# Patient Record
Sex: Female | Born: 1980
Health system: Southern US, Community
[De-identification: ages and names within clinical notes are randomized; demographics above are authoritative.]

## PROBLEM LIST (undated history)

## (undated) ENCOUNTER — Inpatient Hospital Stay (HOSPITAL_COMMUNITY): Payer: Self-pay

## (undated) DIAGNOSIS — H9190 Unspecified hearing loss, unspecified ear: Secondary | ICD-10-CM

## (undated) HISTORY — DX: Unspecified hearing loss, unspecified ear: H91.90

## (undated) HISTORY — PX: OTHER SURGICAL HISTORY: SHX169

---

## 2011-02-06 ENCOUNTER — Encounter (HOSPITAL_COMMUNITY): Payer: Self-pay

## 2011-02-06 ENCOUNTER — Inpatient Hospital Stay (HOSPITAL_COMMUNITY)
Admission: AD | Admit: 2011-02-06 | Discharge: 2011-02-06 | Disposition: A | Discharge status: Home / Self Care | Payer: Self-pay | Source: Ambulatory Visit | Attending: Obstetrics and Gynecology | Admitting: Obstetrics and Gynecology

## 2011-02-06 DIAGNOSIS — O0932 Supervision of pregnancy with insufficient antenatal care, second trimester: Secondary | ICD-10-CM

## 2011-02-06 DIAGNOSIS — Z3201 Encounter for pregnancy test, result positive: Secondary | ICD-10-CM | POA: Insufficient documentation

## 2011-02-06 DIAGNOSIS — O09219 Supervision of pregnancy with history of pre-term labor, unspecified trimester: Secondary | ICD-10-CM | POA: Insufficient documentation

## 2011-02-06 LAB — URINALYSIS, ROUTINE W REFLEX MICROSCOPIC
Bilirubin Urine: NEGATIVE
Leukocytes, UA: NEGATIVE
Nitrite: NEGATIVE
Specific Gravity, Urine: >1.03 — ABNORMAL HIGH (ref 1.005–1.030)
pH: 6 (ref 5.0–8.0)

## 2011-02-06 LAB — POCT PREGNANCY, URINE: Preg Test, Ur: POSITIVE

## 2011-02-06 NOTE — Progress Notes (Signed)
Patient reports no period for 3 months, having urinary frequency, wants to make sure everything okay.

## 2011-02-06 NOTE — ED Provider Notes (Signed)
History     Chief Complaint  Patient presents with  . Urinary Frequency  . Possible Pregnancy   HPI Presents with c/o no period for 3 months. States someone told her she might be pregnant. So she came in to see if baby was OK.   No pain or bleeding. No complaints. Has no doctor.   Past Medical History  Diagnosis Date  . No pertinent past medical history     No past surgical history on file.  No family history on file.  History  Substance Use Topics  . Smoking status: Not on file  . Smokeless tobacco: Not on file  . Alcohol Use: Not on file    Allergies: No Known Allergies  No prescriptions prior to admission    ROS No complaints other than amenorrhea  Physical Exam   Blood pressure 110/75, pulse 84, temperature 98.8 F (37.1 C), temperature source Oral, resp. rate 16, height 5\' 2"  (1.575 m), weight 117 lb 6.4 oz (53.252 kg), last menstrual period 10/24/2010.  Physical Exam  Constitutional: She is oriented to person, place, and time. She appears well-developed and well-nourished.  HENT:  Head: Normocephalic.  Cardiovascular: Normal rate.   Respiratory: Effort normal.  GI: Soft. There is no tenderness.  Genitourinary: No vaginal discharge found.  Musculoskeletal: Normal range of motion.  Neurological: She is alert and oriented to person, place, and time.  Skin: Skin is warm and dry. No erythema.  Psychiatric: She has a normal mood and affect.   Bedside US:  SIUP, Fetus moving. FHR 160.  No measurements taken MAU Course  Procedures  MDM   Assessment and Plan  A:  Intrauterine Pregnancy at 15 weeks by dates.  No complaints  P:  Refer to [facility] for prenatal care Preg. Vertification letter  Wynelle Bourgeois 02/06/2011, 7:59 PM

## 2011-03-21 LAB — RUBELLA ANTIBODY, IGM: Rubella: IMMUNE

## 2011-03-21 LAB — RPR: RPR: NONREACTIVE

## 2011-03-21 LAB — HEPATITIS B SURFACE ANTIGEN: Hepatitis B Surface Ag: NEGATIVE

## 2011-03-21 LAB — HIV ANTIBODY (ROUTINE TESTING W REFLEX): HIV: NONREACTIVE

## 2011-03-21 LAB — VARICELLA ZOSTER ANTIBODY, IGG: Varicella: NON-IMMUNE/NOT IMMUNE

## 2011-03-21 LAB — ANTIBODY SCREEN: Antibody Screen: NEGATIVE

## 2011-07-08 ENCOUNTER — Inpatient Hospital Stay (HOSPITAL_COMMUNITY): Payer: Medicaid Other | Admitting: Anesthesiology

## 2011-07-08 ENCOUNTER — Inpatient Hospital Stay (HOSPITAL_COMMUNITY)
Admission: AD | Admit: 2011-07-08 | Discharge: 2011-07-10 | DRG: 775 | Disposition: A | Discharge status: Home / Self Care | Payer: Medicaid Other | Source: Ambulatory Visit | Attending: Obstetrics and Gynecology | Admitting: Obstetrics and Gynecology

## 2011-07-08 ENCOUNTER — Encounter (HOSPITAL_COMMUNITY): Payer: Self-pay | Admitting: *Deleted

## 2011-07-08 ENCOUNTER — Encounter (HOSPITAL_COMMUNITY): Payer: Self-pay | Admitting: Anesthesiology

## 2011-07-08 LAB — CBC
Platelets: 193 10*3/uL (ref 150–400)
RBC: 4.84 MIL/uL (ref 3.87–5.11)
RDW: 13.3 % (ref 11.5–15.5)
WBC: 13.3 10*3/uL — ABNORMAL HIGH (ref 4.0–10.5)

## 2011-07-08 LAB — RPR: RPR Ser Ql: NONREACTIVE

## 2011-07-08 MED ORDER — DIPHENHYDRAMINE HCL 50 MG/ML IJ SOLN: 12.5000 mg | INTRAMUSCULAR | Status: DC | PRN

## 2011-07-08 MED ORDER — DIBUCAINE 1 % RE OINT: 1.0000 "application " | TOPICAL_OINTMENT | RECTAL | Status: DC | PRN

## 2011-07-08 MED ORDER — WITCH HAZEL-GLYCERIN EX PADS: 1.0000 "application " | MEDICATED_PAD | CUTANEOUS | Status: DC | PRN

## 2011-07-08 MED ORDER — OXYTOCIN BOLUS FROM INFUSION
500.0000 mL | Freq: Once | INTRAVENOUS | Status: AC
  Administered 2011-07-08: 500 mL via INTRAVENOUS
  Filled 2011-07-08: qty 1000
  Filled 2011-07-08: qty 500

## 2011-07-08 MED ORDER — BENZOCAINE-MENTHOL 20-0.5 % EX AERO
INHALATION_SPRAY | CUTANEOUS | Status: AC
  Filled 2011-07-08: qty 56

## 2011-07-08 MED ORDER — CITRIC ACID-SODIUM CITRATE 334-500 MG/5ML PO SOLN: 30.0000 mL | ORAL | Status: DC | PRN

## 2011-07-08 MED ORDER — EPHEDRINE 5 MG/ML INJ
10.0000 mg | INTRAVENOUS | Status: DC | PRN
  Filled 2011-07-08: qty 4

## 2011-07-08 MED ORDER — BISACODYL 10 MG RE SUPP: 10.0000 mg | Freq: Every day | RECTAL | Status: DC | PRN

## 2011-07-08 MED ORDER — ONDANSETRON HCL 4 MG PO TABS: 4.0000 mg | ORAL_TABLET | ORAL | Status: DC | PRN

## 2011-07-08 MED ORDER — FLEET ENEMA 7-19 GM/118ML RE ENEM: 1.0000 | ENEMA | RECTAL | Status: DC | PRN

## 2011-07-08 MED ORDER — EPHEDRINE 5 MG/ML INJ
10.0000 mg | INTRAVENOUS | Status: DC | PRN
  Filled 2011-07-08 (×2): qty 4

## 2011-07-08 MED ORDER — FLEET ENEMA 7-19 GM/118ML RE ENEM: 1.0000 | ENEMA | Freq: Every day | RECTAL | Status: DC | PRN

## 2011-07-08 MED ORDER — ACETAMINOPHEN 325 MG PO TABS: 650.0000 mg | ORAL_TABLET | ORAL | Status: DC | PRN

## 2011-07-08 MED ORDER — FENTANYL 2.5 MCG/ML BUPIVACAINE 1/10 % EPIDURAL INFUSION (WH - ANES)
14.0000 mL/h | INTRAMUSCULAR | Status: DC
  Administered 2011-07-08: 14 mL/h via EPIDURAL
  Filled 2011-07-08: qty 60

## 2011-07-08 MED ORDER — LACTATED RINGERS IV SOLN
INTRAVENOUS | Status: DC
  Administered 2011-07-08: 16:00:00 via INTRAVENOUS

## 2011-07-08 MED ORDER — DIPHENHYDRAMINE HCL 25 MG PO CAPS: 25.0000 mg | ORAL_CAPSULE | Freq: Four times a day (QID) | ORAL | Status: DC | PRN

## 2011-07-08 MED ORDER — OXYCODONE-ACETAMINOPHEN 5-325 MG PO TABS
1.0000 | ORAL_TABLET | ORAL | Status: DC | PRN
  Administered 2011-07-10: 1 via ORAL
  Filled 2011-07-08: qty 1

## 2011-07-08 MED ORDER — OXYTOCIN 20 UNITS IN LACTATED RINGERS INFUSION - SIMPLE: 125.0000 mL/h | Freq: Once | INTRAVENOUS | Status: DC

## 2011-07-08 MED ORDER — SIMETHICONE 80 MG PO CHEW: 80.0000 mg | CHEWABLE_TABLET | ORAL | Status: DC | PRN

## 2011-07-08 MED ORDER — TETANUS-DIPHTH-ACELL PERTUSSIS 5-2.5-18.5 LF-MCG/0.5 IM SUSP: 0.5000 mL | Freq: Once | INTRAMUSCULAR | Status: DC

## 2011-07-08 MED ORDER — IBUPROFEN 600 MG PO TABS
600.0000 mg | ORAL_TABLET | Freq: Four times a day (QID) | ORAL | Status: DC
  Administered 2011-07-08 – 2011-07-10 (×7): 600 mg via ORAL
  Filled 2011-07-08 (×2): qty 1
  Filled 2011-07-08: qty 2
  Filled 2011-07-08 (×3): qty 1

## 2011-07-08 MED ORDER — SENNOSIDES-DOCUSATE SODIUM 8.6-50 MG PO TABS
2.0000 | ORAL_TABLET | Freq: Every day | ORAL | Status: DC
  Administered 2011-07-08 – 2011-07-09 (×2): 2 via ORAL

## 2011-07-08 MED ORDER — ONDANSETRON HCL 4 MG/2ML IJ SOLN: 4.0000 mg | INTRAMUSCULAR | Status: DC | PRN

## 2011-07-08 MED ORDER — BENZOCAINE-MENTHOL 20-0.5 % EX AERO
1.0000 "application " | INHALATION_SPRAY | CUTANEOUS | Status: DC | PRN
  Administered 2011-07-08: 1 via TOPICAL

## 2011-07-08 MED ORDER — LIDOCAINE HCL (PF) 1 % IJ SOLN
30.0000 mL | INTRAMUSCULAR | Status: DC | PRN
  Administered 2011-07-08: 30 mL via SUBCUTANEOUS
  Filled 2011-07-08: qty 30

## 2011-07-08 MED ORDER — OXYCODONE-ACETAMINOPHEN 5-325 MG PO TABS: 1.0000 | ORAL_TABLET | ORAL | Status: DC | PRN

## 2011-07-08 MED ORDER — PHENYLEPHRINE 40 MCG/ML (10ML) SYRINGE FOR IV PUSH (FOR BLOOD PRESSURE SUPPORT)
80.0000 ug | PREFILLED_SYRINGE | INTRAVENOUS | Status: DC | PRN
  Filled 2011-07-08 (×2): qty 5

## 2011-07-08 MED ORDER — ONDANSETRON HCL 4 MG/2ML IJ SOLN: 4.0000 mg | Freq: Four times a day (QID) | INTRAMUSCULAR | Status: DC | PRN

## 2011-07-08 MED ORDER — PRENATAL MULTIVITAMIN CH
1.0000 | ORAL_TABLET | Freq: Every day | ORAL | Status: DC
  Administered 2011-07-09 – 2011-07-10 (×2): 1 via ORAL
  Filled 2011-07-08 (×2): qty 1

## 2011-07-08 MED ORDER — LACTATED RINGERS IV SOLN
500.0000 mL | Freq: Once | INTRAVENOUS | Status: AC
  Administered 2011-07-08: 500 mL via INTRAVENOUS

## 2011-07-08 MED ORDER — IBUPROFEN 600 MG PO TABS: 600.0000 mg | ORAL_TABLET | Freq: Four times a day (QID) | ORAL | Status: DC | PRN

## 2011-07-08 MED ORDER — LACTATED RINGERS IV SOLN: 500.0000 mL | INTRAVENOUS | Status: DC | PRN

## 2011-07-08 MED ORDER — ZOLPIDEM TARTRATE 5 MG PO TABS: 5.0000 mg | ORAL_TABLET | Freq: Every evening | ORAL | Status: DC | PRN

## 2011-07-08 MED ORDER — LANOLIN HYDROUS EX OINT: TOPICAL_OINTMENT | CUTANEOUS | Status: DC | PRN

## 2011-07-08 MED ORDER — PHENYLEPHRINE 40 MCG/ML (10ML) SYRINGE FOR IV PUSH (FOR BLOOD PRESSURE SUPPORT)
80.0000 ug | PREFILLED_SYRINGE | INTRAVENOUS | Status: DC | PRN
  Filled 2011-07-08: qty 5

## 2011-07-08 MED ORDER — LIDOCAINE HCL 1.5 % IJ SOLN
INTRAMUSCULAR | Status: DC | PRN
  Administered 2011-07-08 (×2): 5 mL via EPIDURAL

## 2011-07-08 NOTE — H&P (Signed)
Amanda Farley is a 31 y.o. female presenting for SROM and UCs. Cx in office is 4/C/0/vtx with clear gross ROM.  No CNS change, epigastric pain. Maternal Medical History:  Reason for admission: Reason for admission: rupture of membranes and contractions.  Contractions: Onset was 3-5 hours ago.   Frequency: regular.   Perceived severity is strong.    Fetal activity: Perceived fetal activity is normal.      OB History    Grav Para Term Preterm Abortions TAB SAB Ect Mult Living   1              Past Medical History  Diagnosis Date  . No pertinent past medical history    History reviewed. No pertinent past surgical history. Family History: family history is not on file. Social History:  reports that she has never smoked. She does not have any smokeless tobacco history on file. She reports that she does not drink alcohol or use illicit drugs.  Review of Systems  Eyes: Negative for blurred vision.  Gastrointestinal: Negative for abdominal pain.  Neurological: Negative for headaches.    Dilation: 10 Effacement (%): 100 Station: +2 Exam by:: [email or web address]-ob Blood pressure 116/63, pulse 74, temperature 98.2 F (36.8 C), temperature source Oral, resp. rate 20, height 5\' 2"  (1.575 m), weight 58.968 kg (130 lb), last menstrual period 10/24/2010, SpO2 100.00%. Maternal Exam:  Abdomen: Patient reports no abdominal tenderness.   Fetal Exam Fetal Monitor Review: Pattern: accelerations present.       Physical Exam  Cardiovascular: Normal rate and regular rhythm.   Respiratory: Effort normal and breath sounds normal.  Neurological: She has normal reflexes.    Prenatal labs: ABO, Rh: O/Positive/-- (10/15 0000) Antibody: Negative (10/15 0000) Rubella: Immune (10/15 0000) RPR: Nonreactive (10/15 0000)  HBsAg: Negative (10/15 0000)  HIV: Non-reactive (10/15 0000)  GBS: Negative (10/15 0000)   Assessment/Plan: 31 yo G1Po at 36 5/7 weeks in labor with SROM Admit   Sair Faulcon II,Reyaansh Merlo  E 07/08/2011, 4:58 PM

## 2011-07-08 NOTE — Anesthesia Preprocedure Evaluation (Signed)

## 2011-07-08 NOTE — Plan of Care (Signed)
Problem: Consults Goal: Birthing Suites Patient Information Press F2 to bring up selections list  Outcome: Completed/Met Date Met:  07/08/11  Pt < [redacted] weeks EGA

## 2011-07-08 NOTE — Progress Notes (Signed)
Delivery note SVD VMI  Apgars 9/9 Art pH/ weight pending Placenta 3 vessels/ intact EBL  250cc Second degree rt periurethral lac repaired Cx/vag intact Pt/infant stable in LDR

## 2011-07-08 NOTE — Anesthesia Procedure Notes (Signed)
Epidural Patient location during procedure: OB Start time: 07/08/2011 1:56 PM  Staffing Anesthesiologist: Brayton Caves R Performed by: anesthesiologist   Preanesthetic Checklist Completed: patient identified, site marked, surgical consent, pre-op evaluation, timeout performed, IV checked, risks and benefits discussed and monitors and equipment checked  Epidural Patient position: sitting Prep: site prepped and draped and DuraPrep Patient monitoring: continuous pulse ox and blood pressure Approach: midline Injection technique: LOR air and LOR saline  Needle:  Needle type: Tuohy  Needle gauge: 17 G Needle length: 9 cm Needle insertion depth: 5 cm cm Catheter type: closed end flexible Catheter size: 19 Gauge Catheter at skin depth: 10 cm Test dose: negative  Assessment Events: blood not aspirated, injection not painful, no injection resistance, negative IV test and no paresthesia  Additional Notes Patient identified.  Risk benefits discussed including failed block, incomplete pain control, headache, nerve damage, paralysis, blood pressure changes, nausea, vomiting, reactions to medication both toxic or allergic, and postpartum back pain.  Patient expressed understanding and wished to proceed.  All questions were answered.  Sterile technique used throughout procedure and epidural site dressed with sterile barrier dressing. No paresthesia or other complications noted.The patient did not experience any signs of intravascular injection such as tinnitus or metallic taste in mouth nor signs of intrathecal spread such as rapid motor block. Please see nursing notes for vital signs.

## 2011-07-09 LAB — CBC
Hemoglobin: 12.9 g/dL (ref 12.0–15.0)
MCH: 27.4 pg (ref 26.0–34.0)
MCHC: 32.8 g/dL (ref 30.0–36.0)
Platelets: 216 10*3/uL (ref 150–400)
RBC: 4.7 MIL/uL (ref 3.87–5.11)

## 2011-07-09 MED ORDER — PRENATAL MULTIVITAMIN CH: 1.0000 | ORAL_TABLET | Freq: Every day | ORAL | Status: DC

## 2011-07-09 MED ORDER — OXYCODONE-ACETAMINOPHEN 5-325 MG PO TABS: 1.0000 | ORAL_TABLET | ORAL | Status: AC | PRN

## 2011-07-09 MED ORDER — IBUPROFEN 600 MG PO TABS: 600.0000 mg | ORAL_TABLET | Freq: Four times a day (QID) | ORAL | Status: AC

## 2011-07-09 NOTE — Discharge Summary (Signed)
Obstetric Discharge Summary Reason for Admission: onset of labor Prenatal Procedures: ultrasound Intrapartum Procedures: spontaneous vaginal delivery Postpartum Procedures: none Complications-Operative and Postpartum: none Hemoglobin  Date Value Range Status  07/09/2011 12.9  12.0-15.0 (g/dL) Final     HCT  Date Value Range Status  07/09/2011 39.3  36.0-46.0 (%) Final    Discharge Diagnoses: Preterm pregnancy delivered  Discharge Information: Date: 07/09/2011 Activity: pelvic rest Diet: routine Medications: PNV, Ibuprofen and Percocet Condition: stable Instructions: refer to practice specific booklet Discharge to: home Follow-up Information    Call to follow up.         Newborn Data: Live born female  Birth Weight: 6 lb 15.5 oz (3161 g) APGAR: 9, 9  Home with mother.  Annebelle Bostic II,Timberlynn Kizziah E 07/09/2011, 8:44 AM2

## 2011-07-09 NOTE — Progress Notes (Signed)
Voiding, decreasing lochia, little pain  Blood pressure 98/65, pulse 82, temperature 97.4 F (36.3 C), temperature source Oral, resp. rate 18, height 5\' 2"  (1.575 m), weight 58.968 kg (130 lb), last menstrual period 10/24/2010, SpO2 99.00%, unknown if currently breastfeeding.  FFNT  Results for orders placed during the hospital encounter of 07/08/11 (from the past 24 hour(s))  CBC     Status: Abnormal   Collection Time   07/08/11 12:40 PM      Component Value Range   WBC 13.3 (*) 4.0 - 10.5 (K/uL)   RBC 4.84  3.87 - 5.11 (MIL/uL)   Hemoglobin 13.8  12.0 - 15.0 (g/dL)   HCT 21.3  08.6 - 57.8 (%)   MCV 84.5  78.0 - 100.0 (fL)   MCH 28.5  26.0 - 34.0 (pg)   MCHC 33.7  30.0 - 36.0 (g/dL)   RDW 46.9  62.9 - 52.8 (%)   Platelets 193  150 - 400 (K/uL)  RPR     Status: Normal   Collection Time   07/08/11 12:40 PM      Component Value Range   RPR NON REACTIVE  NON REACTIVE   CBC     Status: Abnormal   Collection Time   07/09/11  5:33 AM      Component Value Range   WBC 16.0 (*) 4.0 - 10.5 (K/uL)   RBC 4.70  3.87 - 5.11 (MIL/uL)   Hemoglobin 12.9  12.0 - 15.0 (g/dL)   HCT 41.3  24.4 - 01.0 (%)   MCV 83.6  78.0 - 100.0 (fL)   MCH 27.4  26.0 - 34.0 (pg)   MCHC 32.8  30.0 - 36.0 (g/dL)   RDW 27.2  53.6 - 64.4 (%)   Platelets 216  150 - 400 (K/uL)  A: satisfactory progress P: pt wants to go home today     Instructions given     D/C at 5 pm Pt inquires about circumcision of female child     D/W pt and husband circumcision of female infant, risks and benefits reviewed-they state they understand, want circumcision for female child

## 2011-07-09 NOTE — Progress Notes (Signed)
Nursery will not discharge baby today Will discontinue D/C order for patient

## 2011-07-09 NOTE — Anesthesia Postprocedure Evaluation (Signed)
  Anesthesia Post-op Note  Patient: Buyer, retail  Procedure(s) Performed: * No procedures listed *  Patient Location: PACU and Mother/Baby  Anesthesia Type: Epidural  Level of Consciousness: awake, alert  and oriented  Airway and Oxygen Therapy: Patient Spontanous Breathing  Post-op Pain: mild  Post-op Assessment: Patient's Cardiovascular Status Stable, Respiratory Function Stable, Patent Airway, No signs of Nausea or vomiting and Pain level controlled  Post-op Vital Signs: stable  Complications: No apparent anesthesia complications

## 2011-07-10 NOTE — Progress Notes (Signed)
Ready to go  Blood pressure 91/49, pulse 80, temperature 98.4 F (36.9 C), temperature source Oral, resp. rate 18, height 5\' 2"  (1.575 m), weight 58.968 kg (130 lb), last menstrual period 10/24/2010, SpO2 99.00%, unknown if currently breastfeeding.  FFNT  A: satisfactory P: D/C home     Instructions reveiwed

## 2011-07-10 NOTE — Progress Notes (Deleted)
No CNS changes/epigastric pain No flatus yet, tolerating regular diet, ambulating, voiding Baby in NICU for hypoglycemia  Blood pressure 91/49, pulse 80, temperature 98.4 F (36.9 C), temperature source Oral, resp. rate 18, height 5\' 2"  (1.575 m), weight 58.968 kg (130 lb), last menstrual period 10/24/2010, SpO2 99.00%, unknown if currently breastfeeding.  Abd soft, good BS, dressing C&D  LFTs improving  A:Preeclampsia-resolving  P: observe until tomorrow     Check LFT tomorrow

## 2011-07-11 NOTE — Progress Notes (Signed)
Post discharge chart review completed.  

## 2012-06-06 HISTORY — PX: INNER EAR SURGERY: SHX679

## 2012-08-11 ENCOUNTER — Ambulatory Visit: Payer: Self-pay | Admitting: Family Medicine

## 2012-08-11 VITALS — BP 98/74 | HR 97 | Temp 98.4°F | Resp 16 | Ht 63.0 in | Wt 103.0 lb

## 2012-08-11 DIAGNOSIS — H7202 Central perforation of tympanic membrane, left ear: Secondary | ICD-10-CM

## 2012-08-11 DIAGNOSIS — H7200 Central perforation of tympanic membrane, unspecified ear: Secondary | ICD-10-CM

## 2012-08-11 DIAGNOSIS — J019 Acute sinusitis, unspecified: Secondary | ICD-10-CM

## 2012-08-11 MED ORDER — PREDNISONE 20 MG PO TABS: ORAL_TABLET | ORAL | Status: DC

## 2012-08-11 MED ORDER — AMOXICILLIN 500 MG PO CAPS: 1000.0000 mg | ORAL_CAPSULE | Freq: Three times a day (TID) | ORAL | Status: DC

## 2012-08-11 NOTE — Progress Notes (Signed)
Subjective:    Patient ID: Amanda Farley, female    DOB: 1980/10/06, 32 y.o.   MRN: [id number] Chief Complaint  Patient presents with  . Sore Throat    2 weeks  . URI    with eyes watery  . Headache    HPI headache and sore throat, whole face and jaw aching.  She has noticed decreased hearing on the left and her ears feel stuffed up/blocked, no drainage and has not been cleaning ears out w/ qtip.  She doesn't have health insurance (trying to get ObamaCare) so avoids coming to doctor but just kept getting more and more ill.  Has not tried any otc meds or home remedies.  History slightly limited by language - pt speaks [place] but native language is [place].  Past Medical History  Diagnosis Date  . No pertinent past medical history    Current Outpatient Prescriptions on File Prior to Visit  Medication Sig Dispense Refill  . Prenatal Vit-Fe Fumarate-FA (PRENATAL MULTIVITAMIN) TABS Take 1 tablet by mouth daily.  30 tablet  2   No current facility-administered medications on file prior to visit.   No Known Allergies  Review of Systems  Constitutional: Positive for diaphoresis, activity change, appetite change and fatigue. Negative for fever and chills.  HENT: Positive for hearing loss, ear pain, congestion, sore throat, rhinorrhea, sneezing, postnasal drip and sinus pressure. Negative for nosebleeds, trouble swallowing, neck pain, neck stiffness, dental problem, voice change, tinnitus and ear discharge.   Eyes: Negative for discharge and itching.  Respiratory: Positive for cough. Negative for shortness of breath.   Cardiovascular: Negative for chest pain.  Gastrointestinal: Negative for nausea, vomiting and abdominal pain.  Musculoskeletal: Positive for myalgias. Negative for joint swelling.  Skin: Negative for rash.  Neurological: Positive for headaches. Negative for dizziness and syncope.  Hematological: Positive for adenopathy.  Psychiatric/Behavioral: Positive for sleep  disturbance.      BP 98/74  Pulse 97  Temp(Src) 98.4 F (36.9 C) (Oral)  Resp 16  Ht 5\' 3"  (1.6 m)  Wt 103 lb (46.72 kg)  BMI 18.25 kg/m2  SpO2 100% Objective:   Physical Exam  Constitutional: She is oriented to person, place, and time. She appears well-developed and well-nourished. She appears lethargic. She appears ill. No distress.  HENT:  Head: Normocephalic and atraumatic.  Right Ear: External ear and ear canal normal. Tympanic membrane is retracted. A middle ear effusion is present.  Left Ear: There is drainage and tenderness. Tympanic membrane is perforated and erythematous. A middle ear effusion is present. Decreased hearing is noted.  Nose: Mucosal edema and rhinorrhea present. Right sinus exhibits maxillary sinus tenderness. Left sinus exhibits maxillary sinus tenderness.  Mouth/Throat: Uvula is midline and mucous membranes are normal. Posterior oropharyngeal erythema present. No oropharyngeal exudate, posterior oropharyngeal edema or tonsillar abscesses.  Large perf in lower L TM with moderate amount of erythema and purulent exudate  Eyes: Conjunctivae are normal. Right eye exhibits no discharge. Left eye exhibits no discharge. No scleral icterus.  Neck: Normal range of motion. Neck supple.  Cardiovascular: Normal rate, regular rhythm, normal heart sounds and intact distal pulses.   Pulmonary/Chest: Effort normal and breath sounds normal.  Lymphadenopathy:       Head (right side): Submandibular adenopathy present. No preauricular and no posterior auricular adenopathy present.       Head (left side): Submandibular adenopathy present. No preauricular and no posterior auricular adenopathy present.    She has no cervical adenopathy.  Right: No supraclavicular adenopathy present.       Left: No supraclavicular adenopathy present.  Neurological: She is oriented to person, place, and time. She appears lethargic.  Skin: Skin is warm and dry. She is not diaphoretic. No  erythema.  Psychiatric: She has a normal mood and affect. Her behavior is normal.      Assessment & Plan:  Sinusitis, acute  Tympanic membrane central perforation, left - strict instructions and print outs given to not put anything into ear - don't get any water into ear - block ear with large cotton ball whenever showering or washing face and never submerge head.    Meds ordered this encounter  Medications  . amoxicillin (AMOXIL) 500 MG capsule    Sig: Take 2 capsules (1,000 mg total) by mouth 3 (three) times daily.    Dispense:  84 capsule    Refill:  0  . predniSONE (DELTASONE) 20 MG tablet    Sig: Take 3 tabs x 2d, 2 tabs x 2d, 1 tab x 2d.    Dispense:  12 tablet    Refill:  0

## 2012-08-11 NOTE — Patient Instructions (Addendum)
Hot showers or breathing in steam may help loosen the congestion.  Using a netti pot or sinus rinse is also likely to help you feel better and keep this from progressing.  Use the oxymetazoline or Afrin nasal spray as needed throughout the day but do not sue for longer than 3 days..  I recommend augmenting with 12 hr sudafed (behind the counter) and generic mucinex to help you move out the congestion.  If no improvement or you are getting worse, come back or call but hopefully with all of the above, you can avoid it.   Tympanic Membrane Perforation The eardrum (tympanic membrane) protects the inner ear from the outside environment. In addition to protection, the eardrum allows you to hear by transmitting sound waves to the bones in your ear and then to the nervous system. The tympanic membrane is easily perforated, which may result in damage to the inner ear. SYMPTOMS   Sometimes there are no symptoms.  Decreased hearing.  Fluid drainage from ear.  Ear pain. CAUSES   Most commonly, a middle ear infection from built-up pressure.  Injury from a cotton swab.  Traumatic injury to the side of the head. RISK INCREASES WITH:  Frequent middle ear infections.  Use of cotton swabs. PREVENTION   Do not use cotton swabs to clean the ear canal.  If you have ear pain or pressure, see your caregiver to rule out an ear infection that needs treatment. TREATMENT  Protecting the inner ear and allowing the membrane to heal on its own is how tympanic membrane rupture is usually treated. Healing may take several weeks. In order to protect the inner ear, do not allow any fluid to enter the ear canal. Avoid being submerged in water. The use of ear drops may prevent an ear infection from developing, but they should be used with caution, as ear drops can also cause damage to the inner ear. It is important to follow up with your caregiver to confirm healing of the tympanic membrane. If the membrane does not  heal, permanent hearing loss may occur. To avoid serious complications, tympanic membranes that do not heal on their own are repaired with surgery. Document Released: 05/23/2005 Document Revised: 08/15/2011 Document Reviewed: 09/04/2008 [facility] Patient Information 2013 [place], [place].   Sinusitis Sinusitis an infection of the air pockets (sinuses) in your face. This can cause puffiness (swelling). It can also cause drainage from your sinuses.  HOME CARE   Only take medicine as told by your doctor.  Drink enough fluids to keep your pee (urine) clear or pale yellow.  Apply moist heat or ice packs for pain relief.  Use salt (saline) nose sprays. The spray will wet the thick fluid in the nose. This can help the sinuses drain. GET HELP RIGHT AWAY IF:   You have a fever.  Your baby is older than 3 months with a rectal temperature of 102 F (38.9 C) or higher.  Your baby is 17 months old or younger with a rectal temperature of 100.4 F (38 C) or higher.  The pain gets worse.  You get a very bad headache.  You keep throwing up (vomiting).  Your face gets puffy. MAKE SURE YOU:   Understand these instructions.  Will watch your condition.  Will get help right away if you are not doing well or get worse. Document Released: 11/09/2007 Document Revised: 08/15/2011 Document Reviewed: 11/09/2007 [facility] Patient Information 2013 [place], [place].

## 2012-08-20 ENCOUNTER — Ambulatory Visit: Payer: Self-pay | Admitting: Family Medicine

## 2012-08-20 VITALS — BP 132/84 | HR 71 | Temp 98.4°F | Resp 17 | Ht 63.0 in | Wt 106.0 lb

## 2012-08-20 DIAGNOSIS — R5381 Other malaise: Secondary | ICD-10-CM

## 2012-08-20 DIAGNOSIS — R5383 Other fatigue: Secondary | ICD-10-CM

## 2012-08-20 DIAGNOSIS — J209 Acute bronchitis, unspecified: Secondary | ICD-10-CM

## 2012-08-20 LAB — POCT CBC
Hemoglobin: 14.5 g/dL (ref 12.2–16.2)
Lymph, poc: 2.3 (ref 0.6–3.4)
MCH, POC: 27.9 pg (ref 27–31.2)
MCHC: 32.4 g/dL (ref 31.8–35.4)
MCV: 85.9 fL (ref 80–97)
MID (cbc): 0.5 (ref 0–0.9)
MPV: 7.9 fL (ref 0–99.8)
POC MID %: 6.8 %M (ref 0–12)
RBC: 5.2 M/uL (ref 4.04–5.48)
WBC: 7.7 10*3/uL (ref 4.6–10.2)

## 2012-08-20 MED ORDER — ALBUTEROL SULFATE HFA 108 (90 BASE) MCG/ACT IN AERS: 2.0000 | INHALATION_SPRAY | RESPIRATORY_TRACT | Status: DC | PRN

## 2012-08-20 MED ORDER — IPRATROPIUM BROMIDE 0.03 % NA SOLN: 2.0000 | Freq: Four times a day (QID) | NASAL | Status: DC

## 2012-08-20 MED ORDER — LEVOFLOXACIN 500 MG PO TABS: 500.0000 mg | ORAL_TABLET | Freq: Every day | ORAL | Status: DC

## 2012-08-20 MED ORDER — OFLOXACIN 0.3 % OT SOLN: 5.0000 [drp] | Freq: Two times a day (BID) | OTIC | Status: DC

## 2012-08-20 MED ORDER — FLUTICASONE PROPIONATE 50 MCG/ACT NA SUSP: 2.0000 | Freq: Every day | NASAL | Status: DC

## 2012-08-20 NOTE — Progress Notes (Addendum)
Subjective:    Patient ID: Amanda Farley, female    DOB: [birth year], 32 y.o.   MRN: [id number] Chief Complaint  Patient presents with  . Dizziness  . Fatigue  . Nasal Congestion  . Shortness of Breath    HPI   I saw Amanda Farley in the office 9d ago and put her on a course of high dose amoxicillin for a sinus infection which she has been taking but she still just doesn't feel good. At that appt she was also have an exudative pharyngitis and was found to have a left TM perf with decreased hearing.  She has now been ill for almost a month.  Only drinking 1 bottle of water daily. Also drinking soda.  Feeling lightheaded - shakey.  She feels she has chills.  The face pressure is less and the sore throat is gone but is having some sweats, chills, fatigued.    Still with a ton of nasal congestion and having occ wheezing, no cough.  She has been very careful of her ear and cautious not to be any water in it.  Past Medical History  Diagnosis Date  . No pertinent past medical history    Current Outpatient Prescriptions on File Prior to Visit  Medication Sig Dispense Refill  . amoxicillin (AMOXIL) 500 MG capsule Take 2 capsules (1,000 mg total) by mouth 3 (three) times daily.  84 capsule  0  . Prenatal Vit-Fe Fumarate-FA (PRENATAL MULTIVITAMIN) TABS Take 1 tablet by mouth daily.  30 tablet  2  . predniSONE (DELTASONE) 20 MG tablet Take 3 tabs x 2d, 2 tabs x 2d, 1 tab x 2d.  12 tablet  0   No current facility-administered medications on file prior to visit.   No Known Allergies  Review of Systems  Constitutional: Positive for chills, diaphoresis, activity change, appetite change and fatigue. Negative for fever and unexpected weight change.  HENT: Positive for hearing loss, congestion, rhinorrhea and sneezing. Negative for ear pain, nosebleeds, sore throat, trouble swallowing, neck pain, neck stiffness, voice change, postnasal drip, sinus pressure and ear discharge.   Eyes: Negative for discharge and  itching.  Respiratory: Positive for shortness of breath and wheezing. Negative for cough.   Cardiovascular: Negative for chest pain and palpitations.  Gastrointestinal: Negative for nausea, vomiting and abdominal pain.  Endocrine: Negative for polydipsia and polyuria.  Genitourinary: Positive for decreased urine volume. Negative for dysuria, urgency and difficulty urinating.  Skin: Negative for rash.  Neurological: Positive for dizziness and light-headedness. Negative for syncope and headaches.  Hematological: Negative for adenopathy.  Psychiatric/Behavioral: Negative for sleep disturbance.      BP 132/84  Pulse 71  Temp(Src) 98.4 F (36.9 C) (Oral)  Resp 17  Ht 5\' 3"  (1.6 m)  Wt 106 lb (48.081 kg)  BMI 18.78 kg/m2  SpO2 99%  LMP 08/10/2012  Breastfeeding? No Objective:   Physical Exam  Constitutional: She is oriented to person, place, and time. She appears well-developed and well-nourished. No distress.  HENT:  Head: Normocephalic and atraumatic.  Right Ear: Tympanic membrane, external ear and ear canal normal.  Left Ear: There is drainage. No tenderness. Tympanic membrane is perforated. Decreased hearing is noted.  Nose: Rhinorrhea present. No mucosal edema.  Mouth/Throat: Uvula is midline, oropharynx is clear and moist and mucous membranes are normal. No oropharyngeal exudate, posterior oropharyngeal edema or posterior oropharyngeal erythema.  Eyes: Conjunctivae are normal. Right eye exhibits no discharge. Left eye exhibits no discharge. No scleral icterus.  Neck: Normal  range of motion. Neck supple.  Cardiovascular: Normal rate, regular rhythm, normal heart sounds and intact distal pulses.   Pulmonary/Chest: Effort normal and breath sounds normal.  Abdominal: Soft. Bowel sounds are normal. She exhibits no distension and no mass. There is no tenderness. There is no rebound and no guarding.  Lymphadenopathy:       Head (right side): Submandibular adenopathy present. No  tonsillar, no preauricular, no posterior auricular and no occipital adenopathy present.       Head (left side): Submandibular adenopathy present. No tonsillar, no preauricular, no posterior auricular and no occipital adenopathy present.    She has cervical adenopathy.       Right cervical: Superficial cervical adenopathy present.       Left cervical: Superficial cervical adenopathy present.       Right: No supraclavicular adenopathy present.       Left: No supraclavicular adenopathy present.  Neurological: She is alert and oriented to person, place, and time.  Skin: Skin is warm and dry. She is not diaphoretic. No erythema.  Psychiatric: She has a normal mood and affect. Her behavior is normal.       Results for orders placed in visit on 08/20/12  POCT CBC      Result Value Range   WBC 7.7  4.6 - 10.2 K/uL   Lymph, poc 2.3  0.6 - 3.4   POC LYMPH PERCENT 29.5  10 - 50 %L   MID (cbc) 0.5  0 - 0.9   POC MID % 6.8  0 - 12 %M   POC Granulocyte 4.9  2 - 6.9   Granulocyte percent 63.7  37 - 80 %G   RBC 5.20  4.04 - 5.48 M/uL   Hemoglobin 14.5  12.2 - 16.2 g/dL   HCT, POC 16.1  09.6 - 47.9 %   MCV 85.9  80 - 97 fL   MCH, POC 27.9  27 - 31.2 pg   MCHC 32.4  31.8 - 35.4 g/dL   RDW, POC 04.5     Platelet Count, POC 436 (*) 142 - 424 K/uL   MPV 7.9  0 - 99.8 fL   Assessment & Plan:  Left TM perf - give it sev more wks of antibiotics and no water in ear. If hearing does not come back, call and we will refer to ENT. Pt would like to avoid this if possible as she does not have health insurance and the cost may be prohibitive but she would really like to get her hearing back. Other malaise and fatigue - Plan: POCT CBC  Acute bronchitis - Exam much improved from last visit but she is still feeling poorly so will switch her antibiotic therapy and try to direct symptomatic treatment to her nasal congestion.  Try prn albuterol for the wheezing. See pt instructions for complete plan.  Meds ordered  this encounter  Medications  . levofloxacin (LEVAQUIN) 500 MG tablet    Sig: Take 1 tablet (500 mg total) by mouth daily.    Dispense:  7 tablet    Refill:  0  . ipratropium (ATROVENT) 0.03 % nasal spray    Sig: Place 2 sprays into the nose 4 (four) times daily.    Dispense:  30 mL    Refill:  1  . fluticasone (FLONASE) 50 MCG/ACT nasal spray    Sig: Place 2 sprays into the nose at bedtime.    Dispense:  16 g    Refill:  1  .  albuterol (PROVENTIL HFA;VENTOLIN HFA) 108 (90 BASE) MCG/ACT inhaler    Sig: Inhale 2 puffs into the lungs every 4 (four) hours as needed for wheezing (cough, shortness of breath or wheezing.).    Dispense:  1 Inhaler    Refill:  1  . ofloxacin (FLOXIN) 0.3 % otic solution    Sig: Place 5 drops into the left ear 2 (two) times daily.    Dispense:  5 mL    Refill:  0

## 2013-08-28 ENCOUNTER — Ambulatory Visit (INDEPENDENT_AMBULATORY_CARE_PROVIDER_SITE_OTHER): Payer: BC Managed Care – PPO | Admitting: Physician Assistant

## 2013-08-28 VITALS — BP 100/70 | HR 82 | Temp 99.9°F | Resp 18 | Ht 63.0 in | Wt 108.2 lb

## 2013-08-28 DIAGNOSIS — J329 Chronic sinusitis, unspecified: Secondary | ICD-10-CM

## 2013-08-28 DIAGNOSIS — R0981 Nasal congestion: Secondary | ICD-10-CM

## 2013-08-28 DIAGNOSIS — H9190 Unspecified hearing loss, unspecified ear: Secondary | ICD-10-CM

## 2013-08-28 DIAGNOSIS — J3489 Other specified disorders of nose and nasal sinuses: Secondary | ICD-10-CM

## 2013-08-28 MED ORDER — AMOXICILLIN-POT CLAVULANATE 875-125 MG PO TABS: 1.0000 | ORAL_TABLET | Freq: Two times a day (BID) | ORAL | Status: DC

## 2013-08-28 MED ORDER — IPRATROPIUM BROMIDE 0.06 % NA SOLN: 2.0000 | Freq: Three times a day (TID) | NASAL | Status: DC

## 2013-08-28 NOTE — Progress Notes (Signed)
Subjective:    Patient ID: Amanda Farley, female    DOB: 06/22/1980, 33 y.o.   MRN: [id number]  HPI Primary Physician: No primary provider on file.  Chief Complaint: URI x 3 days  HPI: 33 y.o. female with history below presents with 3 day history of nasal congestion, rhinorrhea, post nasal drip, sinus pressure, sneezing, sore throat, fever, chills, fatigue, and myalgias. No cough or SOB. Some wheezing. Headache located along the frontal and bilateral temples. Left greater than right otalgia. Muffled hearing. No drainage from the ears. Boyfriend sick with similar symptoms. He was diagnosed with sinus infection. Currently on antibiotic as of the previous day.   Secondly, she mentions a long standing history of decreased hearing of the left ear and having had a history of a ruptured TM as a child. This was finally repaired in October 2014, back in [place]. She continues to note decreased hearing in the left ear as well as off and on pain in the left ear since this procedure. She wants to have her hearing back to baseline in the left ear again. No drainage or discharge from the ear. She does not recall an initial injury or trauma that caused the perforation to begin with as a child.    Past Medical History  Diagnosis Date  . No pertinent past medical history      Home Meds: Prior to Admission medications   Medication Sig Start Date End Date Taking? Authorizing Provider                                                             Allergies: No Known Allergies  History   Social History  . Marital Status: Single    Spouse Name: N/A    Number of Children: N/A  . Years of Education: N/A   Occupational History  . Not on file.   Social History Main Topics  . Smoking status: Never Smoker   . Smokeless tobacco: Not on file  . Alcohol Use: No  . Drug Use: No  . Sexual Activity: Yes   Other Topics Concern  . Not on file   Social History Narrative  . No  narrative on file     Review of Systems  Constitutional: Positive for fever, chills, appetite change and fatigue.  HENT: Positive for congestion, ear pain, hearing loss, postnasal drip, rhinorrhea, sinus pressure, sneezing and sore throat.        Nasal congestion.   Respiratory: Positive for wheezing. Negative for cough and shortness of breath.   Gastrointestinal: Negative for nausea, vomiting and diarrhea.  Musculoskeletal: Positive for myalgias.  Neurological: Positive for headaches.       Frontal sinus and bilateral temples.        Objective:   Physical Exam  Physical Exam: Blood pressure 100/70, pulse 82, temperature 99.9 F (37.7 C), temperature source Oral, resp. rate 18, height 5\' 3"  (1.6 m), weight 108 lb 3.2 oz (49.079 kg), last menstrual period 08/27/2013, SpO2 100.00%., Body mass index is 19.17 kg/(m^2). General: Well developed, well nourished, in no acute distress. Head: Normocephalic, atraumatic, eyes without discharge, sclera non-icteric, nares are congested. Bilateral auditory canals clear. Right TM without perforation, pearly grey and translucent with reflective cone of light. Left TM with moderate amount  of scar tissue. No perforation.  Oral cavity moist, posterior pharynx with post nasal drip and mild erythema. No exudate or peritonsillar abscess. Uvula midline.  Neck: Supple. No thyromegaly. Full ROM. No lymphadenopathy. No nuchal rigidity.  Lungs: Clear bilaterally to auscultation without wheezes, rales, or rhonchi. Breathing is unlabored. Heart: RRR with S1 S2. No murmurs, rubs, or gallops appreciated. Msk:  Strength and tone normal for age. Extremities/Skin: Warm and dry. No clubbing or cyanosis. No edema. No rashes or suspicious lesions. Neuro: Alert and oriented X 3. Moves all extremities spontaneously. Gait is normal. CNII-XII grossly in tact. Psych:  Responds to questions appropriately with a normal affect.         Assessment & Plan:  33 year old female  with sinusitis and decreased/scared left tympanic membrane  1) Sinusitis -Augmentin 875/125 mg 1 po bid #20 no RF -Atrovent NS 0.06% 2 sprays each nare bid prn #1 no RF -Mucinex -Rest/fluids -RTC precautions  2) Decreased hearing/scarring of the left TM -This is a long term issue for her dating back to her childhood. In October of 2014, back in her home country of [place], she recently had repair of longstanding ruptured left TM. She still complains of decreased hearing and occasional pain along the left ear. No drainage or discharge. She wants to be able to hear in that ear again.  -Referral to ENT -Discussed evaluation and therapy options   Eula Listen, MHS, PA-C [facility] [address] [place], [place] [address] [phone] [facility] 08/28/2013 2:13 PM

## 2013-10-24 LAB — OB RESULTS CONSOLE HEPATITIS B SURFACE ANTIGEN: Hepatitis B Surface Ag: NEGATIVE

## 2013-10-24 LAB — OB RESULTS CONSOLE HIV ANTIBODY (ROUTINE TESTING): HIV: NONREACTIVE

## 2013-10-24 LAB — OB RESULTS CONSOLE RPR: RPR: NONREACTIVE

## 2013-10-24 LAB — OB RESULTS CONSOLE ABO/RH: RH TYPE: POSITIVE

## 2013-10-24 LAB — OB RESULTS CONSOLE ANTIBODY SCREEN: ANTIBODY SCREEN: NEGATIVE

## 2013-10-24 LAB — OB RESULTS CONSOLE RUBELLA ANTIBODY, IGM: Rubella: IMMUNE

## 2013-11-11 LAB — OB RESULTS CONSOLE GC/CHLAMYDIA
CHLAMYDIA, DNA PROBE: NEGATIVE
Gonorrhea: NEGATIVE

## 2014-05-03 ENCOUNTER — Inpatient Hospital Stay (HOSPITAL_COMMUNITY)
Admission: AD | Admit: 2014-05-03 | Discharge: 2014-05-03 | Disposition: A | Discharge status: Home / Self Care | Payer: BC Managed Care – PPO | Source: Ambulatory Visit | Attending: Obstetrics and Gynecology | Admitting: Obstetrics and Gynecology

## 2014-05-03 ENCOUNTER — Encounter (HOSPITAL_COMMUNITY): Payer: Self-pay | Admitting: *Deleted

## 2014-05-03 DIAGNOSIS — A084 Viral intestinal infection, unspecified: Secondary | ICD-10-CM | POA: Diagnosis not present

## 2014-05-03 DIAGNOSIS — E860 Dehydration: Secondary | ICD-10-CM | POA: Insufficient documentation

## 2014-05-03 DIAGNOSIS — Z3A35 35 weeks gestation of pregnancy: Secondary | ICD-10-CM | POA: Diagnosis not present

## 2014-05-03 DIAGNOSIS — O26893 Other specified pregnancy related conditions, third trimester: Secondary | ICD-10-CM | POA: Insufficient documentation

## 2014-05-03 LAB — CBC WITH DIFFERENTIAL/PLATELET
Basophils Absolute: 0 10*3/uL (ref 0.0–0.1)
Basophils Relative: 0 % (ref 0–1)
EOS PCT: 0 % (ref 0–5)
Eosinophils Absolute: 0 10*3/uL (ref 0.0–0.7)
HEMATOCRIT: 34.9 % — AB (ref 36.0–46.0)
Hemoglobin: 11.5 g/dL — ABNORMAL LOW (ref 12.0–15.0)
LYMPHS ABS: 0.7 10*3/uL (ref 0.7–4.0)
Lymphocytes Relative: 6 % — ABNORMAL LOW (ref 12–46)
MCH: 27.3 pg (ref 26.0–34.0)
MCHC: 33 g/dL (ref 30.0–36.0)
MCV: 82.7 fL (ref 78.0–100.0)
MONO ABS: 0.8 10*3/uL (ref 0.1–1.0)
Monocytes Relative: 7 % (ref 3–12)
Neutro Abs: 10.7 10*3/uL — ABNORMAL HIGH (ref 1.7–7.7)
Neutrophils Relative %: 87 % — ABNORMAL HIGH (ref 43–77)
Platelets: 154 10*3/uL (ref 150–400)
RBC: 4.22 MIL/uL (ref 3.87–5.11)
RDW: 13.4 % (ref 11.5–15.5)
WBC: 12.2 10*3/uL — ABNORMAL HIGH (ref 4.0–10.5)

## 2014-05-03 LAB — URINALYSIS, ROUTINE W REFLEX MICROSCOPIC
BILIRUBIN URINE: NEGATIVE
Glucose, UA: NEGATIVE mg/dL
HGB URINE DIPSTICK: NEGATIVE
Ketones, ur: >80 mg/dL — AB
Nitrite: NEGATIVE
Protein, ur: NEGATIVE mg/dL
Specific Gravity, Urine: >1.03 — ABNORMAL HIGH (ref 1.005–1.030)
UROBILINOGEN UA: 0.2 mg/dL (ref 0.0–1.0)
pH: 6.5 (ref 5.0–8.0)

## 2014-05-03 LAB — URINE MICROSCOPIC-ADD ON

## 2014-05-03 MED ORDER — PROMETHAZINE HCL 12.5 MG PO TABS: 12.5000 mg | ORAL_TABLET | Freq: Four times a day (QID) | ORAL | Status: DC | PRN

## 2014-05-03 MED ORDER — PROMETHAZINE HCL 25 MG/ML IJ SOLN
25.0000 mg | INTRAMUSCULAR | Status: DC
  Administered 2014-05-03: 25 mg via INTRAVENOUS
  Filled 2014-05-03: qty 1

## 2014-05-03 NOTE — MAU Note (Signed)
Patient presents with complaints of abdominal pain and vomiting since 2400 last night.

## 2014-05-03 NOTE — Discharge Instructions (Signed)
Dehydration, Adult Dehydration is when you lose more fluids from the body than you take in. Vital organs like the kidneys, brain, and heart cannot function without a proper amount of fluids and salt. Any loss of fluids from the body can cause dehydration.  CAUSES   Vomiting.  Diarrhea.  Excessive sweating.  Excessive urine output.  Fever. SYMPTOMS  Mild dehydration  Thirst.  Dry lips.  Slightly dry mouth. Moderate dehydration  Very dry mouth.  Sunken eyes.  Skin does not bounce back quickly when lightly pinched and released.  Dark urine and decreased urine production.  Decreased tear production.  Headache. Severe dehydration  Very dry mouth.  Extreme thirst.  Rapid, weak pulse (more than 100 beats per minute at rest).  Cold hands and feet.  Not able to sweat in spite of heat and temperature.  Rapid breathing.  Blue lips.  Confusion and lethargy.  Difficulty being awakened.  Minimal urine production.  No tears. DIAGNOSIS  Your caregiver will diagnose dehydration based on your symptoms and your exam. Blood and urine tests will help confirm the diagnosis. The diagnostic evaluation should also identify the cause of dehydration. TREATMENT  Treatment of mild or moderate dehydration can often be done at home by increasing the amount of fluids that you drink. It is best to drink small amounts of fluid more often. Drinking too much at one time can make vomiting worse. Refer to the home care instructions below. Severe dehydration needs to be treated at the hospital where you will probably be given intravenous (IV) fluids that contain water and electrolytes. HOME CARE INSTRUCTIONS   Ask your caregiver about specific rehydration instructions.  Drink enough fluids to keep your urine clear or pale yellow.  Drink small amounts frequently if you have nausea and vomiting.  Eat as you normally do.  Avoid:  Foods or drinks high in sugar.  Carbonated  drinks.  Juice.  Extremely hot or cold fluids.  Drinks with caffeine.  Fatty, greasy foods.  Alcohol.  Tobacco.  Overeating.  Gelatin desserts.  Wash your hands well to avoid spreading bacteria and viruses.  Only take over-the-counter or prescription medicines for pain, discomfort, or fever as directed by your caregiver.  Ask your caregiver if you should continue all prescribed and over-the-counter medicines.  Keep all follow-up appointments with your caregiver. SEEK MEDICAL CARE IF:  You have abdominal pain and it increases or stays in one area (localizes).  You have a rash, stiff neck, or severe headache.  You are irritable, sleepy, or difficult to awaken.  You are weak, dizzy, or extremely thirsty. SEEK IMMEDIATE MEDICAL CARE IF:   You are unable to keep fluids down or you get worse despite treatment.  You have frequent episodes of vomiting or diarrhea.  You have blood or green matter (bile) in your vomit.  You have blood in your stool or your stool looks black and tarry.  You have not urinated in 6 to 8 hours, or you have only urinated a small amount of very dark urine.  You have a fever.  You faint. MAKE SURE YOU:   Understand these instructions.  Will watch your condition.  Will get help right away if you are not doing well or get worse. Document Released: 05/23/2005 Document Revised: 08/15/2011 Document Reviewed: 01/10/2011 ExitCare Patient Information 2015 [employer]. This information is not intended to replace advice given to you by your health care provider. Make sure you discuss any questions you have with your health care   provider.   Viral Gastroenteritis Viral gastroenteritis is also known as stomach flu. This condition affects the stomach and intestinal tract. It can cause sudden diarrhea and vomiting. The illness typically lasts 3 to 8 days. Most people develop an immune response that eventually gets rid of the virus. While this natural  response develops, the virus can make you quite ill. CAUSES  Many different viruses can cause gastroenteritis, such as rotavirus or noroviruses. You can catch one of these viruses by consuming contaminated food or water. You may also catch a virus by sharing utensils or other personal items with an infected person or by touching a contaminated surface. SYMPTOMS  The most common symptoms are diarrhea and vomiting. These problems can cause a severe loss of body fluids (dehydration) and a body salt (electrolyte) imbalance. Other symptoms may include:  Fever.  Headache.  Fatigue.  Abdominal pain. DIAGNOSIS  Your caregiver can usually diagnose viral gastroenteritis based on your symptoms and a physical exam. A stool sample may also be taken to test for the presence of viruses or other infections. TREATMENT  This illness typically goes away on its own. Treatments are aimed at rehydration. The most serious cases of viral gastroenteritis involve vomiting so severely that you are not able to keep fluids down. In these cases, fluids must be given through an intravenous line (IV). HOME CARE INSTRUCTIONS   Drink enough fluids to keep your urine clear or pale yellow. Drink small amounts of fluids frequently and increase the amounts as tolerated.  Ask your caregiver for specific rehydration instructions.  Avoid:  Foods high in sugar.  Alcohol.  Carbonated drinks.  Tobacco.  Juice.  Caffeine drinks.  Extremely hot or cold fluids.  Fatty, greasy foods.  Too much intake of anything at one time.  Dairy products until 24 to 48 hours after diarrhea stops.  You may consume probiotics. Probiotics are active cultures of beneficial bacteria. They may lessen the amount and number of diarrheal stools in adults. Probiotics can be found in yogurt with active cultures and in supplements.  Wash your hands well to avoid spreading the virus.  Only take over-the-counter or prescription medicines for  pain, discomfort, or fever as directed by your caregiver. Do not give aspirin to children. Antidiarrheal medicines are not recommended.  Ask your caregiver if you should continue to take your regular prescribed and over-the-counter medicines.  Keep all follow-up appointments as directed by your caregiver. SEEK IMMEDIATE MEDICAL CARE IF:   You are unable to keep fluids down.  You do not urinate at least once every 6 to 8 hours.  You develop shortness of breath.  You notice blood in your stool or vomit. This may look like coffee grounds.  You have abdominal pain that increases or is concentrated in one small area (localized).  You have persistent vomiting or diarrhea.  You have a fever.  The patient is a child younger than 3 months, and he or she has a fever.  The patient is a child older than 3 months, and he or she has a fever and persistent symptoms.  The patient is a child older than 3 months, and he or she has a fever and symptoms suddenly get worse.  The patient is a baby, and he or she has no tears when crying. MAKE SURE YOU:   Understand these instructions.  Will watch your condition.  Will get help right away if you are not doing well or get worse. Document Released: 05/23/2005 Document Revised:   08/15/2011 Document Reviewed: 03/09/2011 ExitCare Patient Information 2015 [employer]. This information is not intended to replace advice given to you by your health care provider. Make sure you discuss any questions you have with your health care provider.  

## 2014-05-03 NOTE — MAU Provider Note (Signed)
Chief Complaint:  Abdominal Pain and Emesis   First Provider Initiated Contact with Patient 05/03/14 1002      HPI: Amanda Farley is a 33 y.o. G2P1001 at 5644w3d who reports onset last night about midnight of nausea and vomiting. She has vomited several times. Has retained no food or fluid since before midnight. Since episode began, she's been having generalized intermittent abdominal pain throughout abdomen and worse with fetal movement. No others in household are ill.  Denies contractions, leakage of fluid or vaginal bleeding. Good fetal movement.   ROS: Pertinent findings in history of present illness. Denies diarrhea, fever, dysuria, hematuria, frequency or urgency of urination, back pain. No respiratory symptoms or malaise.  Pregnancy Course: Essentially uncomplicated  Past Medical History: Past Medical History  Diagnosis Date  . Medical history non-contributory     Past obstetric history: OB History  Gravida Para Term Preterm AB SAB TAB Ectopic Multiple Living  2 1 1       2     # Outcome Date GA Lbr Len/2nd Weight Sex Delivery Anes PTL Lv  2 Current           1 Term 07/08/11    M Vag-Spont EPI  Y      Past Surgical History: Past Surgical History  Procedure Laterality Date  . Inner ear surgery Left 2014     Family History: History reviewed. No pertinent family history.  Social History: History  Substance Use Topics  . Smoking status: Never Smoker   . Smokeless tobacco: Never Used  . Alcohol Use: No    Allergies: No Known Allergies  Meds:  No prescriptions prior to admission      Physical Exam  Blood pressure 105/60, pulse 105, temperature 99 F (37.2 C), temperature source Oral, resp. rate 16, height 5\' 2"  (1.575 m), weight 60.328 kg (133 lb), last menstrual period 08/28/2013. GENERAL: Well-developed, well-nourished female in no acute distress, appears fatigued HEENT: normocephalic HEART: normal rate RESP: normal effort ABDOMEN: Soft, non-tender, gravid  appropriate for gestational age EXTREMITIES: Nontender, no edema NEURO: alert and oriented    Dilation: 1 Effacement (%): Thick Station: -3 Presentation: Vertex Exam by:: D. Poe  FHT:  Baseline 150 , moderate variability, accelerations present, no decelerations Contractions: q 2-4 mins, mild   Labs: Results for orders placed or performed during the hospital encounter of 05/03/14 (from the past 24 hour(s))  Urinalysis, Routine w reflex microscopic     Status: Abnormal   Collection Time: 05/03/14  9:20 AM  Result Value Ref Range   Color, Urine YELLOW YELLOW   APPearance CLEAR CLEAR   Specific Gravity, Urine >1.030 (H) 1.005 - 1.030   pH 6.5 5.0 - 8.0   Glucose, UA NEGATIVE NEGATIVE mg/dL   Hgb urine dipstick NEGATIVE NEGATIVE   Bilirubin Urine NEGATIVE NEGATIVE   Ketones, ur >80 (A) NEGATIVE mg/dL   Protein, ur NEGATIVE NEGATIVE mg/dL   Urobilinogen, UA 0.2 0.0 - 1.0 mg/dL   Nitrite NEGATIVE NEGATIVE   Leukocytes, UA SMALL (A) NEGATIVE  Urine microscopic-add on     Status: Abnormal   Collection Time: 05/03/14  9:20 AM  Result Value Ref Range   Squamous Epithelial / LPF MANY (A) RARE   WBC, UA 3-6 <3 WBC/hpf   Bacteria, UA RARE RARE   Urine-Other MUCOUS PRESENT   CBC with Differential     Status: Abnormal   Collection Time: 05/03/14 10:15 AM  Result Value Ref Range   WBC 12.2 (H) 4.0 - 10.5  K/uL   RBC 4.22 3.87 - 5.11 MIL/uL   Hemoglobin 11.5 (L) 12.0 - 15.0 g/dL   HCT 40.934.9 (L) 81.136.0 - 91.446.0 %   MCV 82.7 78.0 - 100.0 fL   MCH 27.3 26.0 - 34.0 pg   MCHC 33.0 30.0 - 36.0 g/dL   RDW 78.213.4 95.611.5 - 21.315.5 %   Platelets 154 150 - 400 K/uL   Neutrophils Relative % 87 (H) 43 - 77 %   Neutro Abs 10.7 (H) 1.7 - 7.7 K/uL   Lymphocytes Relative 6 (L) 12 - 46 %   Lymphs Abs 0.7 0.7 - 4.0 K/uL   Monocytes Relative 7 3 - 12 %   Monocytes Absolute 0.8 0.1 - 1.0 K/uL   Eosinophils Relative 0 0 - 5 %   Eosinophils Absolute 0.0 0.0 - 0.7 K/uL   Basophils Relative 0 0 - 1 %   Basophils  Absolute 0.0 0.0 - 0.1 K/uL   Smear Review MORPHOLOGY UNREMARKABLE     Imaging:  No results found. MAU Course: IV LR 1000 with Phenergan 25 mg> felt better and retaining juice and crackers No vomiting while here  Assessment:  G2P1002 at 3920w3d Category 1 FHR 1. Viral gastroenteritis   2. Dehydration     Plan: D/W Dr. Marcelle OverlieHolland Discharge home Labor precautions and fetal kick counts    Medication List    TAKE these medications        promethazine 12.5 MG tablet  Commonly known as:  PHENERGAN  Take 1 tablet (12.5 mg total) by mouth every 6 (six) hours as needed for nausea or vomiting.       Follow-up Information    Follow up with Meriel PicaHOLLAND,RICHARD M, MD.   Specialty:  Obstetrics and Gynecology   Why:  Keep your scheduled prenatal appointment   Contact information:   [address] [place] [id number] [phone]       Danae Orleanseirdre C Poe, CNM 05/03/2014 10:06 AM

## 2014-05-05 ENCOUNTER — Encounter (HOSPITAL_COMMUNITY): Payer: Self-pay

## 2014-05-08 LAB — OB RESULTS CONSOLE GBS: STREP GROUP B AG: NEGATIVE

## 2014-05-13 LAB — OB RESULTS CONSOLE GBS: STREP GROUP B AG: NEGATIVE

## 2014-06-06 ENCOUNTER — Inpatient Hospital Stay (HOSPITAL_COMMUNITY)
Admission: AD | Admit: 2014-06-06 | Discharge: 2014-06-08 | DRG: 775 | Disposition: A | Discharge status: Home / Self Care | Payer: Medicaid Other | Source: Ambulatory Visit | Attending: Obstetrics and Gynecology | Admitting: Obstetrics and Gynecology

## 2014-06-06 ENCOUNTER — Encounter (HOSPITAL_COMMUNITY): Payer: Self-pay

## 2014-06-06 DIAGNOSIS — O09523 Supervision of elderly multigravida, third trimester: Secondary | ICD-10-CM

## 2014-06-06 DIAGNOSIS — Z3A40 40 weeks gestation of pregnancy: Secondary | ICD-10-CM | POA: Diagnosis present

## 2014-06-06 NOTE — MAU Note (Signed)
Water broke at 7:30 pm, clear fluid with blood tinge. Felt pressure in bottom when walking, every 20 min or so.  Baby moving but slower today since 12 pm.

## 2014-06-07 ENCOUNTER — Encounter (HOSPITAL_COMMUNITY): Payer: Self-pay | Admitting: *Deleted

## 2014-06-07 DIAGNOSIS — O09523 Supervision of elderly multigravida, third trimester: Secondary | ICD-10-CM | POA: Diagnosis not present

## 2014-06-07 DIAGNOSIS — Z3A40 40 weeks gestation of pregnancy: Secondary | ICD-10-CM | POA: Diagnosis present

## 2014-06-07 LAB — TYPE AND SCREEN
ABO/RH(D): O POS
ANTIBODY SCREEN: NEGATIVE

## 2014-06-07 LAB — CBC
HCT: 32.5 % — ABNORMAL LOW (ref 36.0–46.0)
HEMATOCRIT: 33.8 % — AB (ref 36.0–46.0)
HEMOGLOBIN: 10.6 g/dL — AB (ref 12.0–15.0)
HEMOGLOBIN: 10.9 g/dL — AB (ref 12.0–15.0)
MCH: 26 pg (ref 26.0–34.0)
MCH: 26.2 pg (ref 26.0–34.0)
MCHC: 32.2 g/dL (ref 30.0–36.0)
MCHC: 32.6 g/dL (ref 30.0–36.0)
MCV: 80.2 fL (ref 78.0–100.0)
MCV: 80.7 fL (ref 78.0–100.0)
Platelets: 171 10*3/uL (ref 150–400)
Platelets: 190 10*3/uL (ref 150–400)
RBC: 4.05 MIL/uL (ref 3.87–5.11)
RBC: 4.19 MIL/uL (ref 3.87–5.11)
RDW: 14.3 % (ref 11.5–15.5)
RDW: 14.4 % (ref 11.5–15.5)
WBC: 13 10*3/uL — ABNORMAL HIGH (ref 4.0–10.5)
WBC: 7.4 10*3/uL (ref 4.0–10.5)

## 2014-06-07 LAB — POCT FERN TEST: POCT FERN TEST: POSITIVE

## 2014-06-07 LAB — ABO/RH: ABO/RH(D): O POS

## 2014-06-07 MED ORDER — SIMETHICONE 80 MG PO CHEW: 80.0000 mg | CHEWABLE_TABLET | ORAL | Status: DC | PRN

## 2014-06-07 MED ORDER — ONDANSETRON HCL 4 MG PO TABS: 4.0000 mg | ORAL_TABLET | ORAL | Status: DC | PRN

## 2014-06-07 MED ORDER — IBUPROFEN 600 MG PO TABS
600.0000 mg | ORAL_TABLET | Freq: Four times a day (QID) | ORAL | Status: DC
  Administered 2014-06-07 – 2014-06-08 (×5): 600 mg via ORAL
  Filled 2014-06-07 (×5): qty 1

## 2014-06-07 MED ORDER — ACETAMINOPHEN 325 MG PO TABS
650.0000 mg | ORAL_TABLET | ORAL | Status: DC | PRN
Start: 2014-06-07 — End: 2014-06-07

## 2014-06-07 MED ORDER — LIDOCAINE HCL (PF) 1 % IJ SOLN
30.0000 mL | INTRAMUSCULAR | Status: DC | PRN
  Filled 2014-06-07: qty 30

## 2014-06-07 MED ORDER — LANOLIN HYDROUS EX OINT: TOPICAL_OINTMENT | CUTANEOUS | Status: DC | PRN

## 2014-06-07 MED ORDER — TETANUS-DIPHTH-ACELL PERTUSSIS 5-2.5-18.5 LF-MCG/0.5 IM SUSP: 0.5000 mL | Freq: Once | INTRAMUSCULAR | Status: DC

## 2014-06-07 MED ORDER — DIBUCAINE 1 % RE OINT: 1.0000 "application " | TOPICAL_OINTMENT | RECTAL | Status: DC | PRN

## 2014-06-07 MED ORDER — SENNOSIDES-DOCUSATE SODIUM 8.6-50 MG PO TABS
2.0000 | ORAL_TABLET | ORAL | Status: DC
  Administered 2014-06-08: 2 via ORAL
  Filled 2014-06-07: qty 2

## 2014-06-07 MED ORDER — CITRIC ACID-SODIUM CITRATE 334-500 MG/5ML PO SOLN: 30.0000 mL | ORAL | Status: DC | PRN

## 2014-06-07 MED ORDER — DIPHENHYDRAMINE HCL 25 MG PO CAPS: 25.0000 mg | ORAL_CAPSULE | Freq: Four times a day (QID) | ORAL | Status: DC | PRN

## 2014-06-07 MED ORDER — ZOLPIDEM TARTRATE 5 MG PO TABS: 5.0000 mg | ORAL_TABLET | Freq: Every evening | ORAL | Status: DC | PRN

## 2014-06-07 MED ORDER — OXYCODONE-ACETAMINOPHEN 5-325 MG PO TABS: 2.0000 | ORAL_TABLET | ORAL | Status: DC | PRN

## 2014-06-07 MED ORDER — LACTATED RINGERS IV SOLN
INTRAVENOUS | Status: DC
  Administered 2014-06-07: 01:00:00 via INTRAVENOUS

## 2014-06-07 MED ORDER — LACTATED RINGERS IV SOLN: 500.0000 mL | INTRAVENOUS | Status: DC | PRN

## 2014-06-07 MED ORDER — WITCH HAZEL-GLYCERIN EX PADS: 1.0000 "application " | MEDICATED_PAD | CUTANEOUS | Status: DC | PRN

## 2014-06-07 MED ORDER — OXYCODONE-ACETAMINOPHEN 5-325 MG PO TABS: 1.0000 | ORAL_TABLET | ORAL | Status: DC | PRN

## 2014-06-07 MED ORDER — METHYLERGONOVINE MALEATE 0.2 MG/ML IJ SOLN
0.2000 mg | Freq: Once | INTRAMUSCULAR | Status: AC
  Administered 2014-06-07: 0.2 mg via INTRAMUSCULAR
  Filled 2014-06-07: qty 1

## 2014-06-07 MED ORDER — ONDANSETRON HCL 4 MG/2ML IJ SOLN: 4.0000 mg | INTRAMUSCULAR | Status: DC | PRN

## 2014-06-07 MED ORDER — FLEET ENEMA 7-19 GM/118ML RE ENEM: 1.0000 | ENEMA | Freq: Every day | RECTAL | Status: DC | PRN

## 2014-06-07 MED ORDER — FLEET ENEMA 7-19 GM/118ML RE ENEM: 1.0000 | ENEMA | RECTAL | Status: DC | PRN

## 2014-06-07 MED ORDER — OXYTOCIN 40 UNITS IN LACTATED RINGERS INFUSION - SIMPLE MED
62.5000 mL/h | INTRAVENOUS | Status: DC
  Administered 2014-06-07: 62.5 mL/h via INTRAVENOUS
  Filled 2014-06-07: qty 1000

## 2014-06-07 MED ORDER — BENZOCAINE-MENTHOL 20-0.5 % EX AERO: 1.0000 "application " | INHALATION_SPRAY | CUTANEOUS | Status: DC | PRN

## 2014-06-07 MED ORDER — BISACODYL 10 MG RE SUPP: 10.0000 mg | Freq: Every day | RECTAL | Status: DC | PRN

## 2014-06-07 MED ORDER — PRENATAL MULTIVITAMIN CH
1.0000 | ORAL_TABLET | Freq: Every day | ORAL | Status: DC
  Administered 2014-06-07 – 2014-06-08 (×2): 1 via ORAL
  Filled 2014-06-07 (×2): qty 1

## 2014-06-07 MED ORDER — ONDANSETRON HCL 4 MG/2ML IJ SOLN: 4.0000 mg | Freq: Four times a day (QID) | INTRAMUSCULAR | Status: DC | PRN

## 2014-06-07 MED ORDER — OXYTOCIN BOLUS FROM INFUSION
500.0000 mL | INTRAVENOUS | Status: DC
Start: 2014-06-07 — End: 2014-06-07
  Administered 2014-06-07: 500 mL via INTRAVENOUS

## 2014-06-07 NOTE — H&P (Signed)
Amanda Farley is a 34 y.o. female presenting for UCs. Maternal Medical History:  Reason for admission: Contractions.   Fetal activity: Perceived fetal activity is normal.      OB History    Gravida Para Term Preterm AB TAB SAB Ectopic Multiple Living   2 1 0 1 0 0 0 0  1     Past Medical History  Diagnosis Date  . No pertinent past medical history   . Medical history non-contributory    Past Surgical History  Procedure Laterality Date  . Left tm    . Inner ear surgery Left 2014   Family History: family history is not on file. Social History:  reports that she has never smoked. She has never used smokeless tobacco. She reports that she does not drink alcohol or use illicit drugs.   Prenatal Transfer Tool  Maternal Diabetes: No Genetic Screening: Normal Maternal Ultrasounds/Referrals: Normal Fetal Ultrasounds or other Referrals:  None Maternal Substance Abuse:  No Significant Maternal Medications:  None Significant Maternal Lab Results:  None Other Comments:  None  Review of Systems  Eyes: Negative for blurred vision.  Gastrointestinal: Negative for abdominal pain.  Neurological: Negative for headaches.    Dilation: 10 Effacement (%): 90 Station: +3 Exam by:: L Connors RN Blood pressure 107/57, pulse 87, temperature 98.4 F (36.9 C), temperature source Oral, resp. rate 20, height  (1.575 m), weight 139 lb (63.05 kg), last menstrual period 08/28/2013. Maternal Exam:  Abdomen: Fetal presentation: vertex     Fetal Exam Fetal State Assessment: Category I - tracings are normal.     Physical Exam  Cardiovascular: Normal rate and regular rhythm.   Respiratory: Effort normal and breath sounds normal.  GI: Soft.    Prenatal labs: ABO, Rh: --/--/O POS (01/02 0030) Antibody: NEG (01/02 0030) Rubella: Immune (05/21 0000) RPR: Nonreactive (05/21 0000)  HBsAg: Negative (05/21 0000)  HIV: Non-reactive (05/21 0000)  GBS: Negative (12/08 0000)    Assessment/Plan: 34 yo G2P1 in active labor Anticipate vaginal delivery   Amanda Farley II,Herold Salguero E 06/07/2014, 3:56 AM

## 2014-06-07 NOTE — Progress Notes (Signed)
2nd pad saturated with clots, fundus firm and at umbilicus, non distended. Pt to BR to void, voided lg amount. No dizziness. Pad weighed, 450 cc blood loss (1st pad with similar amount of bleeding. Phone call to Dr Henderson Cloud, Methergine ordered and given.

## 2014-06-07 NOTE — Progress Notes (Signed)
svd of viable boy, apgars 9/10, intact perineum

## 2014-06-07 NOTE — Progress Notes (Signed)
Delivery Note At 3:46 AM a viable female was delivered via  (Presentation: ;  ).  APGAR: , ; weight  .   Placenta status: , .  Cord:  with the following complications: .  Cord pH: pending Rapid second stage, one push Anesthesia:  none Episiotomy:   Lacerations:  none Suture Repair: N/A Est. Blood Loss (mL):150   Mom to postpartum.  Baby to Couplet care / Skin to Skin.  Amanda Farley,Amanda Farley E 06/07/2014, 3:58 AM

## 2014-06-07 NOTE — MAU Provider Note (Signed)
S: Amanda Farley is a 34 y.o. year old G7P0101 female at [redacted]w[redacted]d weeks gestation who presents to MAU reporting Spontaneous rupture of membranes at 60.   O: FHR reactive Pos pooling of blood-tinged fluid, pos fern Dilation: 3 Effacement (%): 70 Station: -2 Presentation: Vertex Forebag felt Exam by:: Dorathy Kinsman CNM  A: SROM, early labor  P: Admit to L&D  [place], CNM 06/07/2014 12:09 AM

## 2014-06-07 NOTE — Progress Notes (Signed)
Post Partum Day 0 Subjective: no complaints, up ad lib, voiding and tolerating PO  Objective: Blood pressure 101/60, pulse 84, temperature 98.2 F (36.8 C), temperature source Oral, resp. rate 20, height  (1.575 m), weight 139 lb (63.05 kg), last menstrual period 08/28/2013, SpO2 99 %, unknown if currently breastfeeding.  Physical Exam:  General: alert, cooperative and no distress Lochia: appropriate Uterine Fundus: firm Incision: healing well DVT Evaluation: No evidence of DVT seen on physical exam. Had some increased bleeding after delivery tx with methergine x 1. Lochia now normal.  Recent Labs  06/07/14 0030 06/07/14 0816  HGB 10.9* 10.6*  HCT 33.8* 32.5*    Assessment/Plan: Plan for discharge tomorrow   LOS: 1 day   Amanda Farley,Amanda Farley 06/07/2014, 9:39 AM

## 2014-06-08 LAB — RPR

## 2014-06-08 MED ORDER — PRENATAL MULTIVITAMIN CH: 1.0000 | ORAL_TABLET | Freq: Every day | ORAL | Status: DC

## 2014-06-08 MED ORDER — OXYCODONE-ACETAMINOPHEN 5-325 MG PO TABS: 1.0000 | ORAL_TABLET | Freq: Four times a day (QID) | ORAL | Status: DC | PRN

## 2014-06-08 MED ORDER — IBUPROFEN 600 MG PO TABS: 600.0000 mg | ORAL_TABLET | Freq: Four times a day (QID) | ORAL | Status: DC | PRN

## 2014-06-08 NOTE — Discharge Summary (Signed)
Obstetric Discharge Summary Reason for Admission: onset of labor Prenatal Procedures: none and ultrasound Intrapartum Procedures: spontaneous vaginal delivery Postpartum Procedures: none Complications-Operative and Postpartum: none HEMOGLOBIN  Date Value Ref Range Status  06/07/2014 10.6* 12.0 - 15.0 g/dL Final  21/30/8657 84.6 12.2 - 16.2 g/dL Final   HCT  Date Value Ref Range Status  06/07/2014 32.5* 36.0 - 46.0 % Final   HCT, POC  Date Value Ref Range Status  08/20/2012 44.7 37.7 - 47.9 % Final    Physical Exam:  General: alert, cooperative and no distress Lochia: appropriate Uterine Fundus: firm Incision: healing well DVT Evaluation: No evidence of DVT seen on physical exam.  Discharge Diagnoses: Term Pregnancy-delivered  Discharge Information: Date: 06/08/2014 Activity: pelvic rest Diet: routine Medications: PNV, Ibuprofen and Percocet Condition: stable Instructions: refer to practice specific booklet Discharge to: home   Newborn Data: Live born female  Birth Weight: 8 lb 2.7 oz (3705 g) APGAR: 9, 10  Home with mother.  Amanda Farley,Amanda Farley 06/08/2014, 8:31 AM

## 2014-06-08 NOTE — Progress Notes (Signed)
Post Partum Day 1 Subjective: no complaints, up ad lib, voiding, tolerating PO and + flatus  Objective: Blood pressure 92/48, pulse 68, temperature 98.1 F (36.7 C), temperature source Oral, resp. rate 17, height  (1.575 m), weight 139 lb (63.05 kg), last menstrual period 08/28/2013, SpO2 99 %, unknown if currently breastfeeding.  Physical Exam:  General: alert, cooperative and no distress Lochia: appropriate Uterine Fundus: firm Incision: healing well DVT Evaluation: No evidence of DVT seen on physical exam.   Recent Labs  06/07/14 0030 06/07/14 0816  HGB 10.9* 10.6*  HCT 33.8* 32.5*    Assessment/Plan: Discharge home   LOS: 2 days   Cheo Selvey II,Fany Cavanaugh E 06/08/2014, 8:27 AM

## 2014-06-09 ENCOUNTER — Ambulatory Visit: Payer: Self-pay

## 2014-06-09 NOTE — Progress Notes (Signed)
Post discharge chart review completed.  

## 2014-06-09 NOTE — Lactation Note (Signed)
This note was copied from the chart of Amanda Chinara Kelch. Lactation Consultation Note; Initial visit with mom. She stated formula feeding on admission but has been pumping and bottle feeding EBM over the last 24 hours. Using a manual pump and reports no pain with pumping. Reports breasts are feeling fuller this morning. Offered assist with latch but mom states she only wants to pump and bottle feed EBM. Encouragement given. BF brochure given with resources for support after DC. Mom plans to get electric breast pump. Encouraged to pump q 2-3 hours to prevent engorgement. No questions at present. To call prn  Patient Name: Amanda Farley [id number] Date: 06/09/2014 Reason for consult: Initial assessment   Maternal Data Formula Feeding for Exclusion: Yes Reason for exclusion: Mother's choice to formula feed on admision Does the patient have breastfeeding experience prior to this delivery?: Yes  Feeding    LATCH Score/Interventions                      Lactation Tools Discussed/Used     Consult Status Consult Status: Complete    Pamelia Hoit 06/09/2014, 9:15 AM

## 2014-07-25 ENCOUNTER — Encounter (HOSPITAL_COMMUNITY): Payer: Self-pay | Admitting: *Deleted

## 2014-07-25 ENCOUNTER — Ambulatory Visit: Payer: 59

## 2014-07-25 ENCOUNTER — Emergency Department (INDEPENDENT_AMBULATORY_CARE_PROVIDER_SITE_OTHER)
Admission: EM | Admit: 2014-07-25 | Discharge: 2014-07-25 | Disposition: A | Discharge status: Home / Self Care | Payer: 59 | Source: Home / Self Care | Attending: Family Medicine | Admitting: Family Medicine

## 2014-07-25 DIAGNOSIS — J069 Acute upper respiratory infection, unspecified: Secondary | ICD-10-CM

## 2014-07-25 MED ORDER — IPRATROPIUM BROMIDE 0.06 % NA SOLN: 2.0000 | Freq: Four times a day (QID) | NASAL | Status: DC

## 2014-07-25 MED ORDER — DEXTROMETHORPHAN POLISTIREX 30 MG/5ML PO LQCR: 60.0000 mg | Freq: Two times a day (BID) | ORAL | Status: DC

## 2014-07-25 NOTE — ED Provider Notes (Signed)
CSN: [id number]     Arrival date & time 07/25/14  1348 History   First MD Initiated Contact with Patient 07/25/14 1456     Chief Complaint  Patient presents with  . URI   (Consider location/radiation/quality/duration/timing/severity/associated sxs/prior Treatment) Patient is a 34 y.o. female presenting with URI. The history is provided by the patient.  URI Presenting symptoms: congestion, cough and rhinorrhea   Presenting symptoms: no fever and no sore throat   Severity:  Mild Onset quality:  Gradual Duration:  2 days Progression:  Unchanged Chronicity:  New Relieved by:  None tried Worsened by:  Nothing tried Ineffective treatments:  None tried   Past Medical History  Diagnosis Date  . No pertinent past medical history   . Medical history non-contributory    Past Surgical History  Procedure Laterality Date  . Left tm    . Inner ear surgery Left 2014   History reviewed. No pertinent family history. History  Substance Use Topics  . Smoking status: Never Smoker   . Smokeless tobacco: Never Used  . Alcohol Use: No   OB History    Gravida Para Term Preterm AB TAB SAB Ectopic Multiple Living   2 2 1 1  0 0 0 0 0 2     Review of Systems  Constitutional: Negative.  Negative for fever and chills.  HENT: Positive for congestion, postnasal drip and rhinorrhea. Negative for sore throat.   Respiratory: Positive for cough.   Cardiovascular: Negative.   Gastrointestinal: Negative.     Allergies  Review of patient's allergies indicates no known allergies.  Home Medications   Prior to Admission medications   Medication Sig Start Date End Date Taking? Authorizing Provider  dextromethorphan (DELSYM) 30 MG/5ML liquid Take 10 mLs (60 mg total) by mouth 2 (two) times daily. Prn cough 07/25/14   Linna HoffJames D Jasslyn Finkel, MD  ibuprofen (ADVIL,MOTRIN) 600 MG tablet Take 1 tablet (600 mg total) by mouth every 6 (six) hours as needed. 06/08/14   Roselle LocusJames E Tomblin II, MD  ipratropium (ATROVENT) 0.06  % nasal spray Place 2 sprays into both nostrils 4 (four) times daily. 07/25/14   Linna HoffJames D Maranatha Grossi, MD  oxyCODONE-acetaminophen (PERCOCET/ROXICET) 5-325 MG per tablet Take 1-2 tablets by mouth every 6 (six) hours as needed (for pain scale less than 7). 06/08/14   Roselle LocusJames E Tomblin II, MD  Prenatal Vit-Fe Fumarate-FA (PRENATAL MULTIVITAMIN) TABS tablet Take 1 tablet by mouth daily at 12 noon. 06/08/14   Roselle LocusJames E Tomblin II, MD   BP 110/80 mmHg  Pulse 90  Temp(Src) 99 F (37.2 C) (Oral)  Resp 20  SpO2 100%  Breastfeeding? Yes Physical Exam  Constitutional: She is oriented to person, place, and time. She appears well-developed and well-nourished. No distress.  HENT:  Head: Normocephalic.  Right Ear: External ear normal.  Left Ear: External ear normal.  Mouth/Throat: Oropharynx is clear and moist.  Eyes: Conjunctivae are normal. Pupils are equal, round, and reactive to light.  Neck: Normal range of motion. Neck supple.  Cardiovascular: Normal heart sounds.   Pulmonary/Chest: Effort normal and breath sounds normal.  Lymphadenopathy:    She has no cervical adenopathy.  Neurological: She is alert and oriented to person, place, and time.  Skin: Skin is warm and dry.  Nursing note and vitals reviewed.   ED Course  Procedures (including critical care time) Labs Review Labs Reviewed - No data to display  Imaging Review No results found.   MDM   1. URI (upper respiratory  infection)        Linna Hoff, MD 07/25/14 [phone]

## 2014-07-25 NOTE — Discharge Instructions (Signed)
Drink plenty of fluids as discussed, use medicine as prescribed, and mucinex or delsym for cough. Return or see your doctor if further problems °

## 2014-07-25 NOTE — ED Notes (Signed)
Pt  Has   Symptoms  Of  Cough   Congested   sorethroat   With  Body  Aches   Symptoms  X     sev  Days      Pt is  Breast  Feeding

## 2015-04-01 ENCOUNTER — Emergency Department (INDEPENDENT_AMBULATORY_CARE_PROVIDER_SITE_OTHER)
Admission: EM | Admit: 2015-04-01 | Discharge: 2015-04-01 | Disposition: A | Discharge status: Home / Self Care | Payer: 59 | Source: Home / Self Care | Attending: Emergency Medicine | Admitting: Emergency Medicine

## 2015-04-01 ENCOUNTER — Encounter (HOSPITAL_COMMUNITY): Payer: Self-pay | Admitting: Emergency Medicine

## 2015-04-01 DIAGNOSIS — J018 Other acute sinusitis: Secondary | ICD-10-CM

## 2015-04-01 MED ORDER — PREDNISONE 50 MG PO TABS: ORAL_TABLET | ORAL | Status: DC

## 2015-04-01 MED ORDER — IPRATROPIUM BROMIDE 0.06 % NA SOLN: 2.0000 | Freq: Four times a day (QID) | NASAL | Status: DC

## 2015-04-01 NOTE — ED Provider Notes (Signed)
CSN: [id number]     Arrival date & time 04/01/15  1800 History   First MD Initiated Contact with Patient 04/01/15 1926     No chief complaint on file.  (Consider location/radiation/quality/duration/timing/severity/associated sxs/prior Treatment) HPI  She is a 34 year old woman here for evaluation of headache and runny nose. She states her symptoms started about one week ago and has gradually been getting worse. She reports a frontal headache. She also describes nasal congestion, rhinorrhea, postnasal drainage. She reports feeling right there is thick mucus draining down her throat. She also has a nonproductive cough. No fevers or chills. No nausea or vomiting. She has not tried any medications.  Past Medical History  Diagnosis Date  . No pertinent past medical history   . Medical history non-contributory    Past Surgical History  Procedure Laterality Date  . Left tm    . Inner ear surgery Left 2014   No family history on file. Social History  Substance Use Topics  . Smoking status: Never Smoker   . Smokeless tobacco: Never Used  . Alcohol Use: No   OB History    Gravida Para Term Preterm AB TAB SAB Ectopic Multiple Living   2 2 1 1  0 0 0 0 0 2     Review of Systems As in history of present illness Allergies  Review of patient's allergies indicates no known allergies.  Home Medications   Prior to Admission medications   Medication Sig Start Date End Date Taking? Authorizing Provider  ibuprofen (ADVIL,MOTRIN) 600 MG tablet Take 1 tablet (600 mg total) by mouth every 6 (six) hours as needed. 06/08/14   Harold HedgeJames Tomblin, MD  ipratropium (ATROVENT) 0.06 % nasal spray Place 2 sprays into both nostrils 4 (four) times daily. 04/01/15   Charm RingsErin J Yerania Chamorro, MD  oxyCODONE-acetaminophen (PERCOCET/ROXICET) 5-325 MG per tablet Take 1-2 tablets by mouth every 6 (six) hours as needed (for pain scale less than 7). 06/08/14   Harold HedgeJames Tomblin, MD  predniSONE (DELTASONE) 50 MG tablet Take 1 pill daily for 5  days. 04/01/15   Charm RingsErin J Dann Ventress, MD  Prenatal Vit-Fe Fumarate-FA (PRENATAL MULTIVITAMIN) TABS tablet Take 1 tablet by mouth daily at 12 noon. 06/08/14   Harold HedgeJames Tomblin, MD   Meds Ordered and Administered this Visit  Medications - No data to display  BP 112/78 mmHg  Pulse 67  Temp(Src) 98.6 F (37 C) (Oral)  Resp 18  SpO2 100% No data found.   Physical Exam  Constitutional: She is oriented to person, place, and time. She appears well-developed and well-nourished. No distress.  HENT:  Mouth/Throat: No oropharyngeal exudate.  She has thick post nasal drainage. Right TM is normal. Left TM has surgical changes.  Neck: Neck supple.  Cardiovascular: Normal rate, regular rhythm and normal heart sounds.   No murmur heard. Pulmonary/Chest: Effort normal and breath sounds normal. No respiratory distress. She has no wheezes. She has no rales.  Lymphadenopathy:    She has cervical adenopathy (right posterior chain).  Neurological: She is alert and oriented to person, place, and time.    ED Course  Procedures (including critical care time)  Labs Review Labs Reviewed - No data to display  Imaging Review No results found.    MDM   1. Other acute sinusitis    Treat with prednisone and Atrovent nasal spray. Recommended frequent use of nasal saline spray. Follow-up as needed.    Charm RingsErin J Necole Minassian, MD 04/01/15 [phone]

## 2015-04-01 NOTE — ED Notes (Signed)
Seen by dr Piedad Climeshonig prior to this nurse

## 2015-04-01 NOTE — Discharge Instructions (Signed)
Your sinuses are inflamed. This is caused by a virus. Take prednisone daily for 5 days. Use Atrovent nasal spray 4 times a day to help with drainage. Use nasal saline spray as often as he can wash out the sinuses. You should start to feel better in the next 2-3 days. If you develop fevers or are not improving by the weekend, please come back.

## 2015-04-09 ENCOUNTER — Emergency Department (INDEPENDENT_AMBULATORY_CARE_PROVIDER_SITE_OTHER)
Admission: EM | Admit: 2015-04-09 | Discharge: 2015-04-09 | Disposition: A | Discharge status: Home / Self Care | Payer: 59 | Source: Home / Self Care | Attending: Family Medicine | Admitting: Family Medicine

## 2015-04-09 ENCOUNTER — Encounter (HOSPITAL_COMMUNITY): Payer: Self-pay | Admitting: *Deleted

## 2015-04-09 DIAGNOSIS — H7192 Unspecified cholesteatoma, left ear: Secondary | ICD-10-CM | POA: Diagnosis not present

## 2015-04-09 MED ORDER — AMOXICILLIN-POT CLAVULANATE 875-125 MG PO TABS: 1.0000 | ORAL_TABLET | Freq: Two times a day (BID) | ORAL | Status: DC

## 2015-04-09 NOTE — Discharge Instructions (Signed)
Call for appt next week to see doctor about your left ear.

## 2015-04-09 NOTE — ED Notes (Signed)
Pt  Reports  A  l  Earache      As  Well   As  Some  Pain  l  Side  Of  Her neck     Symptoms  For  About  1  Week      pt  Reports    No  sorethroat

## 2015-04-09 NOTE — ED Provider Notes (Signed)
CSN: [id number]     Arrival date & time 04/09/15  1606 History   First MD Initiated Contact with Patient 04/09/15 1752     Chief Complaint  Patient presents with  . Otalgia   (Consider location/radiation/quality/duration/timing/severity/associated sxs/prior Treatment) Patient is a 34 y.o. female presenting with ear pain. The history is provided by the patient.  Otalgia Location:  Left Behind ear:  No abnormality Quality:  Throbbing Severity:  Moderate Onset quality:  Gradual Duration:  1 week Progression:  Worsening Chronicity:  Recurrent [facility] Context comment:  Had tm perf on left with surg in [place] 1 yr ago. Associated symptoms: headaches and hearing loss   Associated symptoms: no ear discharge, no fever and no rhinorrhea   Risk factors: chronic ear infection and prior ear surgery     Past Medical History  Diagnosis Date  . No pertinent past medical history   . Medical history non-contributory    Past Surgical History  Procedure Laterality Date  . Left tm    . Inner ear surgery Left 2014   History reviewed. No pertinent family history. Social History  Substance Use Topics  . Smoking status: Never Smoker   . Smokeless tobacco: Never Used  . Alcohol Use: No   OB History    Gravida Para Term Preterm AB TAB SAB Ectopic Multiple Living   2 2 1 1  0 0 0 0 0 2     Review of Systems  Constitutional: Negative.  Negative for fever.  HENT: Positive for ear pain and hearing loss. Negative for ear discharge, postnasal drip and rhinorrhea.   Neurological: Positive for headaches.  Hematological: Negative for adenopathy.  All other systems reviewed and are negative.   Allergies  Review of patient's allergies indicates no known allergies.  Home Medications   Prior to Admission medications   Medication Sig Start Date End Date Taking? Authorizing Provider  amoxicillin-clavulanate (AUGMENTIN) 875-125 MG tablet Take 1 tablet by mouth 2 (two)  times daily. 04/09/15   Linna HoffJames D Lucus Lambertson, MD  ibuprofen (ADVIL,MOTRIN) 600 MG tablet Take 1 tablet (600 mg total) by mouth every 6 (six) hours as needed. 06/08/14   Harold HedgeJames Tomblin, MD  ipratropium (ATROVENT) 0.06 % nasal spray Place 2 sprays into both nostrils 4 (four) times daily. 04/01/15   Charm RingsErin J Honig, MD  oxyCODONE-acetaminophen (PERCOCET/ROXICET) 5-325 MG per tablet Take 1-2 tablets by mouth every 6 (six) hours as needed (for pain scale less than 7). 06/08/14   Harold HedgeJames Tomblin, MD  predniSONE (DELTASONE) 50 MG tablet Take 1 pill daily for 5 days. 04/01/15   Charm RingsErin J Honig, MD  Prenatal Vit-Fe Fumarate-FA (PRENATAL MULTIVITAMIN) TABS tablet Take 1 tablet by mouth daily at 12 noon. 06/08/14   Harold HedgeJames Tomblin, MD   Meds Ordered and Administered this Visit  Medications - No data to display  BP 106/72 mmHg  Pulse 70  Temp(Src) 98.7 F (37.1 C) (Oral)  Resp 16  SpO2 100%  Breastfeeding? No No data found.   Physical Exam  Constitutional: She appears well-developed and well-nourished.  HENT:  Head: Normocephalic.  Right Ear: Hearing, tympanic membrane, external ear and ear canal normal.  Left Ear: Tympanic membrane is scarred, perforated and erythematous. Tympanic membrane mobility is abnormal. Decreased hearing is noted.  Nose: Nose normal.  Mouth/Throat: Oropharynx is clear and moist.  Nursing note and vitals reviewed.   ED Course  Procedures (including critical care time)  Labs Review Labs Reviewed - No data to  display  Imaging Review No results found.   Visual Acuity Review  Right Eye Distance:   Left Eye Distance:   Bilateral Distance:    Right Eye Near:   Left Eye Near:    Bilateral Near:         MDM   1. Cholesteatoma of ear, left        Linna Hoff, MD 04/09/15 [phone]

## 2015-04-22 ENCOUNTER — Ambulatory Visit (INDEPENDENT_AMBULATORY_CARE_PROVIDER_SITE_OTHER): Payer: 59 | Admitting: Internal Medicine

## 2015-04-22 ENCOUNTER — Encounter: Payer: Self-pay | Admitting: Internal Medicine

## 2015-04-22 VITALS — BP 98/60 | HR 75 | Temp 98.4°F | Resp 16 | Ht 62.0 in | Wt 108.0 lb

## 2015-04-22 DIAGNOSIS — H9202 Otalgia, left ear: Secondary | ICD-10-CM

## 2015-04-22 MED ORDER — FLUTICASONE PROPIONATE 50 MCG/ACT NA SUSP: 2.0000 | Freq: Every day | NASAL | Status: DC

## 2015-04-22 MED ORDER — CIPROFLOXACIN-DEXAMETHASONE 0.3-0.1 % OT SUSP: 4.0000 [drp] | Freq: Two times a day (BID) | OTIC | Status: DC

## 2015-04-22 NOTE — Progress Notes (Signed)
   Subjective:    Patient ID: Amanda Farley, female    DOB: 12/29/1980, 34 y.o.   MRN: [id number]  HPI The patient is a 34 YO female coming in for left ear problems. She did have injury with surgery in [place] a couple years ago. Since that time she has had problems with it. Feels full and hurts in the ear and cheek and the neck. She is now on antibiotics which have not helped much. No fevers or chills. Some nose drainage.   [facility], social history reviewed and updated.   Review of Systems  Constitutional: Negative for fever, chills, activity change, appetite change, fatigue and unexpected weight change.  HENT: Positive for congestion, ear discharge, ear pain and hearing loss. Negative for facial swelling, postnasal drip, sinus pressure, sore throat and trouble swallowing.   Eyes: Negative.   Respiratory: Negative for cough, chest tightness, shortness of breath and wheezing.   Cardiovascular: Negative for chest pain, palpitations and leg swelling.  Gastrointestinal: Negative for nausea, abdominal pain, diarrhea, constipation and abdominal distention.  Musculoskeletal: Negative.   Skin: Negative.   Neurological: Negative.   Psychiatric/Behavioral: Negative.       Objective:   Physical Exam  Constitutional: She is oriented to person, place, and time. She appears well-developed and well-nourished.  HENT:  Head: Normocephalic and atraumatic.  Right Ear: External ear normal.  Some debris in the left ear canal, not able to visualize the TM well, oropharynx with redness and clear drainage.   Eyes: EOM are normal.  Neck: Normal range of motion.  Cardiovascular: Normal rate and regular rhythm.   Pulmonary/Chest: Effort normal and breath sounds normal. No respiratory distress. She has no wheezes. She has no rales.  Abdominal: Soft. Bowel sounds are normal. She exhibits no distension. There is no tenderness. There is no rebound.  Neurological: She is alert and oriented to person, place, and  time. Coordination normal.  Skin: Skin is warm and dry.  Psychiatric: She has a normal mood and affect.   Filed Vitals:   04/22/15 1009  BP: 98/60  Pulse: 75  Temp: 98.4 F (36.9 C)  TempSrc: Oral  Resp: 16  Height: 5\' 2"  (1.575 m)  Weight: 108 lb (48.988 kg)  SpO2: 99%      Assessment & Plan:

## 2015-04-22 NOTE — Progress Notes (Signed)
Pre visit review using our clinic review tool, if applicable. No additional management support is needed unless otherwise documented below in the visit note. 

## 2015-04-22 NOTE — Assessment & Plan Note (Signed)
Rx for ciprodex ear drops and if no improvement in 2 weeks will send to ENT. Unclear what kind of surgery done in [place]. Unable to visualize the TM on exam. Also rx for flonase to help with her drainage which should decrease drainage into the ear.

## 2015-04-22 NOTE — Patient Instructions (Signed)
We have sent in 2 medicines to your pharmacy.   One is called ciprodex and is a medicine that goes in the left ear. Use 4 drops in the ear 2 times per day for 2 weeks. If you are not feeling better call our office and we will send you to an ear specialist.   The other one is a nose spray called flonase (fluticasone). Use 2 sprays in each nostril once a day to help decrease the amount of drainage going into your ear.   Call the office with any problems or questions.

## 2015-05-08 ENCOUNTER — Inpatient Hospital Stay (HOSPITAL_COMMUNITY): Payer: 59 | Admitting: Certified Registered Nurse Anesthetist

## 2015-05-08 ENCOUNTER — Encounter: Payer: Self-pay | Admitting: Internal Medicine

## 2015-05-08 ENCOUNTER — Ambulatory Visit (INDEPENDENT_AMBULATORY_CARE_PROVIDER_SITE_OTHER): Payer: 59 | Admitting: Internal Medicine

## 2015-05-08 ENCOUNTER — Emergency Department (HOSPITAL_COMMUNITY): Payer: 59

## 2015-05-08 ENCOUNTER — Encounter (HOSPITAL_COMMUNITY): Admission: EM | Disposition: A | Discharge status: Home / Self Care | Payer: Self-pay | Source: Home / Self Care

## 2015-05-08 ENCOUNTER — Encounter (HOSPITAL_COMMUNITY): Payer: Self-pay

## 2015-05-08 ENCOUNTER — Encounter (HOSPITAL_COMMUNITY): Payer: Self-pay | Admitting: Radiology

## 2015-05-08 ENCOUNTER — Observation Stay (HOSPITAL_COMMUNITY)
Admission: EM | Admit: 2015-05-08 | Discharge: 2015-05-09 | Disposition: A | Discharge status: Home / Self Care | Payer: 59 | Attending: General Surgery | Admitting: General Surgery

## 2015-05-08 VITALS — BP 98/68 | HR 94 | Temp 98.4°F | Wt 105.0 lb

## 2015-05-08 DIAGNOSIS — K353 Acute appendicitis with localized peritonitis: Secondary | ICD-10-CM | POA: Diagnosis not present

## 2015-05-08 DIAGNOSIS — R112 Nausea with vomiting, unspecified: Secondary | ICD-10-CM | POA: Diagnosis not present

## 2015-05-08 DIAGNOSIS — K3580 Unspecified acute appendicitis: Secondary | ICD-10-CM | POA: Diagnosis present

## 2015-05-08 DIAGNOSIS — R1031 Right lower quadrant pain: Secondary | ICD-10-CM

## 2015-05-08 HISTORY — PX: LAPAROSCOPIC APPENDECTOMY: SHX408

## 2015-05-08 LAB — CBC
HCT: 42.2 % (ref 36.0–46.0)
Hemoglobin: 14 g/dL (ref 12.0–15.0)
MCH: 28.1 pg (ref 26.0–34.0)
MCHC: 33.2 g/dL (ref 30.0–36.0)
MCV: 84.6 fL (ref 78.0–100.0)
PLATELETS: 203 10*3/uL (ref 150–400)
RBC: 4.99 MIL/uL (ref 3.87–5.11)
RDW: 13.3 % (ref 11.5–15.5)
WBC: 14.6 10*3/uL — ABNORMAL HIGH (ref 4.0–10.5)

## 2015-05-08 LAB — COMPREHENSIVE METABOLIC PANEL
ALBUMIN: 4.3 g/dL (ref 3.5–5.0)
ALT: 15 U/L (ref 14–54)
AST: 18 U/L (ref 15–41)
Alkaline Phosphatase: 42 U/L (ref 38–126)
Anion gap: 9 (ref 5–15)
BILIRUBIN TOTAL: 2.5 mg/dL — AB (ref 0.3–1.2)
BUN: 12 mg/dL (ref 6–20)
CO2: 20 mmol/L — AB (ref 22–32)
Calcium: 8.3 mg/dL — ABNORMAL LOW (ref 8.9–10.3)
Chloride: 105 mmol/L (ref 101–111)
Creatinine, Ser: 0.56 mg/dL (ref 0.44–1.00)
GFR calc Af Amer: >60 mL/min (ref >60)
GFR calc non Af Amer: >60 mL/min (ref >60)
GLUCOSE: 101 mg/dL — AB (ref 65–99)
POTASSIUM: 3.4 mmol/L — AB (ref 3.5–5.1)
Sodium: 134 mmol/L — ABNORMAL LOW (ref 135–145)
TOTAL PROTEIN: 7.6 g/dL (ref 6.5–8.1)

## 2015-05-08 LAB — URINALYSIS, ROUTINE W REFLEX MICROSCOPIC
Bilirubin Urine: NEGATIVE
Glucose, UA: NEGATIVE mg/dL
HGB URINE DIPSTICK: NEGATIVE
Ketones, ur: NEGATIVE mg/dL
Leukocytes, UA: NEGATIVE
Nitrite: NEGATIVE
PROTEIN: NEGATIVE mg/dL
Specific Gravity, Urine: 1.01 (ref 1.005–1.030)
pH: 6 (ref 5.0–8.0)

## 2015-05-08 LAB — MRSA PCR SCREENING: MRSA by PCR: NEGATIVE

## 2015-05-08 LAB — I-STAT BETA HCG BLOOD, ED (MC, WL, AP ONLY)

## 2015-05-08 LAB — LIPASE, BLOOD: LIPASE: 27 U/L (ref 11–51)

## 2015-05-08 SURGERY — APPENDECTOMY, LAPAROSCOPIC
Anesthesia: General

## 2015-05-08 MED ORDER — CEFTRIAXONE SODIUM 2 G IJ SOLR
INTRAMUSCULAR | Status: AC
  Filled 2015-05-08: qty 2

## 2015-05-08 MED ORDER — SODIUM CHLORIDE 0.9 % IV SOLN
1000.0000 mL | INTRAVENOUS | Status: DC
  Administered 2015-05-08: 1000 mL via INTRAVENOUS

## 2015-05-08 MED ORDER — DEXAMETHASONE SODIUM PHOSPHATE 10 MG/ML IJ SOLN
INTRAMUSCULAR | Status: AC
  Filled 2015-05-08: qty 1

## 2015-05-08 MED ORDER — HYDROMORPHONE HCL 1 MG/ML IJ SOLN: 0.2500 mg | INTRAMUSCULAR | Status: DC | PRN

## 2015-05-08 MED ORDER — ONDANSETRON HCL 4 MG/2ML IJ SOLN
INTRAMUSCULAR | Status: DC | PRN
  Administered 2015-05-08: 4 mg via INTRAVENOUS

## 2015-05-08 MED ORDER — PROPOFOL 10 MG/ML IV BOLUS
INTRAVENOUS | Status: AC
  Filled 2015-05-08: qty 20

## 2015-05-08 MED ORDER — MIDAZOLAM HCL 5 MG/5ML IJ SOLN
INTRAMUSCULAR | Status: DC | PRN
  Administered 2015-05-08: 1 mg via INTRAVENOUS

## 2015-05-08 MED ORDER — ROCURONIUM BROMIDE 100 MG/10ML IV SOLN
INTRAVENOUS | Status: DC | PRN
  Administered 2015-05-08: 25 mg via INTRAVENOUS

## 2015-05-08 MED ORDER — MORPHINE SULFATE (PF) 4 MG/ML IV SOLN
4.0000 mg | Freq: Once | INTRAVENOUS | Status: AC
  Administered 2015-05-08: 4 mg via INTRAVENOUS
  Filled 2015-05-08: qty 1

## 2015-05-08 MED ORDER — DEXAMETHASONE SODIUM PHOSPHATE 10 MG/ML IJ SOLN
INTRAMUSCULAR | Status: DC | PRN
  Administered 2015-05-08: 5 mg via INTRAVENOUS

## 2015-05-08 MED ORDER — LIDOCAINE HCL (CARDIAC) 20 MG/ML IV SOLN
INTRAVENOUS | Status: AC
  Filled 2015-05-08: qty 5

## 2015-05-08 MED ORDER — IOHEXOL 300 MG/ML  SOLN
80.0000 mL | Freq: Once | INTRAMUSCULAR | Status: AC | PRN
  Administered 2015-05-08: 80 mL via INTRAVENOUS

## 2015-05-08 MED ORDER — ONDANSETRON 4 MG PO TBDP: 4.0000 mg | ORAL_TABLET | Freq: Four times a day (QID) | ORAL | Status: DC | PRN

## 2015-05-08 MED ORDER — BUPIVACAINE-EPINEPHRINE (PF) 0.25% -1:200000 IJ SOLN
INTRAMUSCULAR | Status: AC
  Filled 2015-05-08: qty 30

## 2015-05-08 MED ORDER — LACTATED RINGERS IR SOLN
Status: DC | PRN
  Administered 2015-05-08: 1000 mL

## 2015-05-08 MED ORDER — FENTANYL CITRATE (PF) 100 MCG/2ML IJ SOLN
INTRAMUSCULAR | Status: DC | PRN
  Administered 2015-05-08: 50 ug via INTRAVENOUS
  Administered 2015-05-08 (×2): 25 ug via INTRAVENOUS
  Administered 2015-05-08 (×3): 50 ug via INTRAVENOUS

## 2015-05-08 MED ORDER — FENTANYL CITRATE (PF) 250 MCG/5ML IJ SOLN
INTRAMUSCULAR | Status: AC
Start: 2015-05-08 — End: 2015-05-08
  Filled 2015-05-08: qty 5

## 2015-05-08 MED ORDER — MORPHINE SULFATE (PF) 2 MG/ML IV SOLN: 1.0000 mg | INTRAVENOUS | Status: DC | PRN

## 2015-05-08 MED ORDER — MIDAZOLAM HCL 2 MG/2ML IJ SOLN
INTRAMUSCULAR | Status: AC
  Filled 2015-05-08: qty 2

## 2015-05-08 MED ORDER — GLYCOPYRROLATE 0.2 MG/ML IJ SOLN
INTRAMUSCULAR | Status: DC | PRN
  Administered 2015-05-08: 0.2 mg via INTRAVENOUS

## 2015-05-08 MED ORDER — PROPOFOL 10 MG/ML IV BOLUS
INTRAVENOUS | Status: DC | PRN
  Administered 2015-05-08: 100 mg via INTRAVENOUS

## 2015-05-08 MED ORDER — ACETAMINOPHEN 650 MG RE SUPP: 650.0000 mg | Freq: Four times a day (QID) | RECTAL | Status: DC | PRN

## 2015-05-08 MED ORDER — METRONIDAZOLE IN NACL 5-0.79 MG/ML-% IV SOLN
INTRAVENOUS | Status: AC
  Filled 2015-05-08: qty 100

## 2015-05-08 MED ORDER — PROMETHAZINE HCL 25 MG/ML IJ SOLN: 6.2500 mg | INTRAMUSCULAR | Status: DC | PRN

## 2015-05-08 MED ORDER — BUPIVACAINE-EPINEPHRINE 0.25% -1:200000 IJ SOLN
INTRAMUSCULAR | Status: DC | PRN
  Administered 2015-05-08: 17 mL

## 2015-05-08 MED ORDER — ONDANSETRON HCL 4 MG/2ML IJ SOLN
INTRAMUSCULAR | Status: AC
  Filled 2015-05-08: qty 2

## 2015-05-08 MED ORDER — IOHEXOL 300 MG/ML  SOLN
25.0000 mL | Freq: Once | INTRAMUSCULAR | Status: AC | PRN
  Administered 2015-05-08: 25 mL via ORAL

## 2015-05-08 MED ORDER — ACETAMINOPHEN 325 MG PO TABS: 650.0000 mg | ORAL_TABLET | Freq: Four times a day (QID) | ORAL | Status: DC | PRN

## 2015-05-08 MED ORDER — ONDANSETRON HCL 4 MG/2ML IJ SOLN: 4.0000 mg | Freq: Four times a day (QID) | INTRAMUSCULAR | Status: DC | PRN

## 2015-05-08 MED ORDER — ONDANSETRON HCL 4 MG/2ML IJ SOLN
4.0000 mg | Freq: Once | INTRAMUSCULAR | Status: AC
  Administered 2015-05-08: 4 mg via INTRAVENOUS
  Filled 2015-05-08: qty 2

## 2015-05-08 MED ORDER — LACTATED RINGERS IV SOLN
INTRAVENOUS | Status: DC | PRN
  Administered 2015-05-08 (×2): via INTRAVENOUS

## 2015-05-08 MED ORDER — DIPHENHYDRAMINE HCL 25 MG PO CAPS: 25.0000 mg | ORAL_CAPSULE | Freq: Four times a day (QID) | ORAL | Status: DC | PRN

## 2015-05-08 MED ORDER — ACETAMINOPHEN 10 MG/ML IV SOLN
INTRAVENOUS | Status: AC
  Filled 2015-05-08: qty 100

## 2015-05-08 MED ORDER — DIPHENHYDRAMINE HCL 50 MG/ML IJ SOLN: 25.0000 mg | Freq: Four times a day (QID) | INTRAMUSCULAR | Status: DC | PRN

## 2015-05-08 MED ORDER — ACETAMINOPHEN 10 MG/ML IV SOLN
INTRAVENOUS | Status: DC | PRN
  Administered 2015-05-08: 1000 mg via INTRAVENOUS

## 2015-05-08 MED ORDER — ROCURONIUM BROMIDE 100 MG/10ML IV SOLN
INTRAVENOUS | Status: AC
  Filled 2015-05-08: qty 1

## 2015-05-08 MED ORDER — SODIUM CHLORIDE 0.9 % IJ SOLN
INTRAMUSCULAR | Status: AC
  Filled 2015-05-08: qty 10

## 2015-05-08 MED ORDER — LIDOCAINE HCL (CARDIAC) 20 MG/ML IV SOLN
INTRAVENOUS | Status: DC | PRN
  Administered 2015-05-08: 50 mg via INTRAVENOUS

## 2015-05-08 MED ORDER — PIPERACILLIN-TAZOBACTAM 3.375 G IVPB
3.3750 g | Freq: Once | INTRAVENOUS | Status: AC
  Administered 2015-05-08: 3.375 g via INTRAVENOUS
  Filled 2015-05-08: qty 50

## 2015-05-08 MED ORDER — KCL IN DEXTROSE-NACL 20-5-0.45 MEQ/L-%-% IV SOLN
INTRAVENOUS | Status: DC
  Administered 2015-05-08: 18:00:00 via INTRAVENOUS
  Administered 2015-05-09: 125 mL/h via INTRAVENOUS
  Filled 2015-05-08 (×2): qty 1000

## 2015-05-08 MED ORDER — SIMETHICONE 80 MG PO CHEW
40.0000 mg | CHEWABLE_TABLET | Freq: Four times a day (QID) | ORAL | Status: DC | PRN
  Filled 2015-05-08: qty 1

## 2015-05-08 MED ORDER — OXYCODONE HCL 5 MG PO TABS: 5.0000 mg | ORAL_TABLET | ORAL | Status: DC | PRN

## 2015-05-08 MED ORDER — DEXTROSE 5 % IV SOLN
2.0000 g | INTRAVENOUS | Status: DC
  Administered 2015-05-08: 2 g via INTRAVENOUS
  Filled 2015-05-08 (×2): qty 2

## 2015-05-08 MED ORDER — SODIUM CHLORIDE 0.9 % IV SOLN
1000.0000 mL | Freq: Once | INTRAVENOUS | Status: AC
  Administered 2015-05-08: 1000 mL via INTRAVENOUS

## 2015-05-08 MED ORDER — SUCCINYLCHOLINE CHLORIDE 20 MG/ML IJ SOLN
INTRAMUSCULAR | Status: DC | PRN
  Administered 2015-05-08: 60 mg via INTRAVENOUS

## 2015-05-08 MED ORDER — METRONIDAZOLE IN NACL 5-0.79 MG/ML-% IV SOLN
500.0000 mg | Freq: Three times a day (TID) | INTRAVENOUS | Status: DC
  Administered 2015-05-08 – 2015-05-09 (×3): 500 mg via INTRAVENOUS
  Filled 2015-05-08 (×4): qty 100

## 2015-05-08 MED ORDER — EPHEDRINE SULFATE 50 MG/ML IJ SOLN
INTRAMUSCULAR | Status: AC
  Filled 2015-05-08: qty 1

## 2015-05-08 MED ORDER — NEOSTIGMINE METHYLSULFATE 10 MG/10ML IV SOLN
INTRAVENOUS | Status: DC | PRN
  Administered 2015-05-08: 2 mg via INTRAVENOUS

## 2015-05-08 SURGICAL SUPPLY — 27 items
APPLIER CLIP ROT 10 11.4 M/L (STAPLE)
BENZOIN TINCTURE PRP APPL 2/3 (GAUZE/BANDAGES/DRESSINGS) ×2 IMPLANT
CLIP APPLIE ROT 10 11.4 M/L (STAPLE) IMPLANT
COVER SURGICAL LIGHT HANDLE (MISCELLANEOUS) ×2 IMPLANT
CUTTER FLEX LINEAR 45M (STAPLE) ×2 IMPLANT
ELECT REM PT RETURN 9FT ADLT (ELECTROSURGICAL) ×2
ELECTRODE REM PT RTRN 9FT ADLT (ELECTROSURGICAL) ×1 IMPLANT
ENDOLOOP SUT PDS II  0 18 (SUTURE)
ENDOLOOP SUT PDS II 0 18 (SUTURE) IMPLANT
GAUZE SPONGE 2X2 8PLY STRL LF (GAUZE/BANDAGES/DRESSINGS) ×1 IMPLANT
GLOVE SURG ORTHO 8.0 STRL STRW (GLOVE) ×2 IMPLANT
GOWN STRL REUS W/TWL XL LVL3 (GOWN DISPOSABLE) ×4 IMPLANT
KIT BASIN OR (CUSTOM PROCEDURE TRAY) ×2 IMPLANT
POUCH SPECIMEN RETRIEVAL 10MM (ENDOMECHANICALS) ×2 IMPLANT
RELOAD 45 VASCULAR/THIN (ENDOMECHANICALS) IMPLANT
RELOAD STAPLE TA45 3.5 REG BLU (ENDOMECHANICALS) ×2 IMPLANT
SET IRRIG TUBING LAPAROSCOPIC (IRRIGATION / IRRIGATOR) ×2 IMPLANT
SHEARS HARMONIC ACE PLUS 36CM (ENDOMECHANICALS) ×2 IMPLANT
SPONGE GAUZE 2X2 STER 10/PKG (GAUZE/BANDAGES/DRESSINGS) ×1
STRIP CLOSURE SKIN 1/2X4 (GAUZE/BANDAGES/DRESSINGS) ×2 IMPLANT
SUT MNCRL AB 4-0 PS2 18 (SUTURE) ×2 IMPLANT
TOWEL OR 17X26 10 PK STRL BLUE (TOWEL DISPOSABLE) ×2 IMPLANT
TRAY FOLEY W/METER SILVER 14FR (SET/KITS/TRAYS/PACK) ×2 IMPLANT
TRAY LAPAROSCOPIC (CUSTOM PROCEDURE TRAY) ×2 IMPLANT
TROCAR BLADELESS OPT 5 75 (ENDOMECHANICALS) ×2 IMPLANT
TROCAR XCEL BLUNT TIP 100MML (ENDOMECHANICALS) ×2 IMPLANT
TROCAR XCEL NON-BLD 11X100MML (ENDOMECHANICALS) ×2 IMPLANT

## 2015-05-08 NOTE — ED Notes (Signed)
Pt reports 7/10 abd pain but denies the need for pain medication at this time.  She is resting in bed with equal chest rise and fall noted; family is present at the bedside.  No acute distress.  Surgical consent obtained.

## 2015-05-08 NOTE — H&P (Signed)
Amanda Farley [birth year]  [id number].   Chief Complaint/Reason for Consult: acute appendicitis HPI: this is a 34 yo Asian female who has no medical problems who began having abdominal pain yesterday around 10am.  She developed nausea and vomiting, but no fevers or chills.  She did have a couple of BMs, but no diarrhea.  Her pain persisted and came to the [facility] today for evaluation.  Her WBC was 14.5 and a CT of the abd/pel revealed acute suppurative appendicitis. There is some submucosal edema of the cecum and mild reactive ileitis.  We have been asked to see her for admission.  ROS : Please see HPI, otherwise all other systems have been reviewed and are negative.  Family History  Problem Relation Age of Onset  . Family history unknown: Yes    History reviewed. No pertinent past medical history.  Past Surgical History  Procedure Laterality Date  . Left tm    . Inner ear surgery Left 2014    Social History:  reports that she has never smoked. She has never used smokeless tobacco. She reports that she does not drink alcohol or use illicit drugs.  Allergies: No Known Allergies   (Not in a hospital admission)  Blood pressure 110/75, pulse 78, temperature 98.2 F (36.8 C), temperature source Oral, resp. rate 18, SpO2 100 %, not currently breastfeeding. Physical Exam: General: pleasant, WD, WN Asian female who is laying in bed in NAD HEENT: head is normocephalic, atraumatic.  Sclera are noninjected.  PERRL.  Ears and nose without any masses or lesions.  Mouth is pink and moist Heart: regular, rate, and rhythm.  Normal s1,s2. No obvious murmurs, gallops, or rubs noted.  Palpable radial and pedal pulses bilaterally Lungs: CTAB, no wheezes, rhonchi, or rales noted.  Respiratory effort nonlabored Abd: soft, very tender in the RLQ with voluntary guarding, ND, +BS, no masses, hernias, or organomegaly MS: all 4 extremities are symmetrical with no cyanosis, clubbing, or edema. Skin: warm and dry  with no masses, lesions, or rashes Psych: A&Ox3 with an appropriate affect.    Results for orders placed or performed during the hospital encounter of 05/08/15 (from the past 48 hour(s))  CBC     Status: Abnormal   Collection Time: 05/08/15 12:20 PM  Result Value Ref Range   WBC 14.6 (H) 4.0 - 10.5 K/uL   RBC 4.99 3.87 - 5.11 MIL/uL   Hemoglobin 14.0 12.0 - 15.0 g/dL   HCT 42.2 36.0 - 46.0 %   MCV 84.6 78.0 - 100.0 fL   MCH 28.1 26.0 - 34.0 pg   MCHC 33.2 30.0 - 36.0 g/dL   RDW 13.3 11.5 - 15.5 %   Platelets 203 150 - 400 K/uL  I-Stat Beta hCG blood, ED ([facility], [facility], [facility] only)     Status: None   Collection Time: 05/08/15 12:22 PM  Result Value Ref Range   I-stat hCG, quantitative <5.0 <5 mIU/mL   Comment 3            Comment:   GEST. AGE      CONC.  (mIU/mL)   <=1 WEEK        5 - 50     2 WEEKS       50 - 500     3 WEEKS       100 - 10,000     4 WEEKS     1,000 - 30,000        FEMALE AND NON-PREGNANT FEMALE:  LESS THAN 5 mIU/mL   Comprehensive metabolic panel     Status: Abnormal   Collection Time: 05/08/15  1:13 PM  Result Value Ref Range   Sodium 134 (L) 135 - 145 mmol/L   Potassium 3.4 (L) 3.5 - 5.1 mmol/L   Chloride 105 101 - 111 mmol/L   CO2 20 (L) 22 - 32 mmol/L   Glucose, Bld 101 (H) 65 - 99 mg/dL   BUN 12 6 - 20 mg/dL   Creatinine, Ser 0.56 0.44 - 1.00 mg/dL   Calcium 8.3 (L) 8.9 - 10.3 mg/dL   Total Protein 7.6 6.5 - 8.1 g/dL   Albumin 4.3 3.5 - 5.0 g/dL   AST 18 15 - 41 U/L   ALT 15 14 - 54 U/L   Alkaline Phosphatase 42 38 - 126 U/L   Total Bilirubin 2.5 (H) 0.3 - 1.2 mg/dL   GFR calc non Af Amer >60 >60 mL/min   GFR calc Af Amer >60 >60 mL/min    Comment: (NOTE) The eGFR has been calculated using the CKD EPI equation. This calculation has not been validated in all clinical situations. eGFR's persistently <60 mL/min signify possible Chronic Kidney Disease.    Anion gap 9 5 - 15  Lipase, blood     Status: None   Collection Time: 05/08/15  1:13 PM   Result Value Ref Range   Lipase 27 11 - 51 U/L  Urinalysis, Routine w reflex microscopic (not at [facility])     Status: None   Collection Time: 05/08/15  2:30 PM  Result Value Ref Range   Color, Urine YELLOW YELLOW   APPearance CLEAR CLEAR   Specific Gravity, Urine 1.010 1.005 - 1.030   pH 6.0 5.0 - 8.0   Glucose, UA NEGATIVE NEGATIVE mg/dL   Hgb urine dipstick NEGATIVE NEGATIVE   Bilirubin Urine NEGATIVE NEGATIVE   Ketones, ur NEGATIVE NEGATIVE mg/dL   Protein, ur NEGATIVE NEGATIVE mg/dL   Nitrite NEGATIVE NEGATIVE   Leukocytes, UA NEGATIVE NEGATIVE    Comment: MICROSCOPIC NOT DONE ON URINES WITH NEGATIVE PROTEIN, BLOOD, LEUKOCYTES, NITRITE, OR GLUCOSE <1000 mg/dL.   Ct Abdomen Pelvis W Contrast  05/08/2015  CLINICAL DATA:  Mid abdominal pain starting yesterday at 10 a.m. Vomiting after meals. EXAM: CT ABDOMEN AND PELVIS WITH CONTRAST TECHNIQUE: Multidetector CT imaging of the abdomen and pelvis was performed using the standard protocol following bolus administration of intravenous contrast. CONTRAST:  2m OMNIPAQUE IOHEXOL 300 MG/ML SOLN, 842mOMNIPAQUE IOHEXOL 300 MG/ML SOLN COMPARISON:  None. FINDINGS: Lower chest and abdominal wall:  No contributory findings. Hepatobiliary: No focal liver abnormality.No evidence of biliary obstruction or stone. Pancreas: Unremarkable. Spleen: Unremarkable. Adrenals/Urinary Tract: Negative adrenals. No hydronephrosis or stone. Unremarkable bladder. Reproductive:IUD in good position. 34 mm right adnexal cyst, simple. Stomach/Bowel: The appendix is dilated and fluid-filled with thick wall and right lower quadrant inflammation. The tip of the cecum is also inflamed with submucosal edema. No visible necrosis or perforation. Negative for abscess. Mild reactive appearing ileitis. Vascular/Lymphatic: No acute vascular abnormality. Reactive appearing mesenteric adenitis. Peritoneal: No ascites or pneumoperitoneum. Musculoskeletal: No acute abnormalities. These results  were called by telephone at the time of interpretation on 05/08/2015 at 3:06 pm to Dr. MACharlesetta Shanks who verbally acknowledged these results. IMPRESSION: 1. Acute suppurative appendicitis. 2. 34 mm simple right adnexal cyst. Electronically Signed   By: JoMonte Fantasia.D.   On: 05/08/2015 15:07       Assessment/Plan 1. Acute appendicitis -admit, IVFs, prn meds -  Rocephin/Flagyl -to OR tonight for lap appy -plan d/w patient and her husband who are in agreement with this plan.  OSBORNE,KELLY E 05/08/2015, 4:23 PM Pager: [phone]

## 2015-05-08 NOTE — Patient Instructions (Signed)
[facility]

## 2015-05-08 NOTE — Op Note (Signed)
OPERATIVE REPORT - LAPAROSCOPIC APPENDECTOMY  Preop diagnosis: Acute appendicitis  Postop diagnosis: Same  Procedure: Laparoscopic appendectomy  Surgeon:  Velora Hecklerodd M. Kennis Wissmann, MD, FACS  Anesthesia: General endotracheal  Estimated blood loss: Minimal  Preparation: Chlora-prep  Complications: None  Indications:  34 yo Asian female who has no medical problems who began having abdominal pain yesterday around 10am. She developed nausea and vomiting, but no fevers or chills. She did have a couple of BMs, but no diarrhea. Her pain persisted and [facility]. Her WBC was 14.5 and a CT of the abd/pel revealed acute suppurative appendicitis. There is some submucosal edema of the cecum and mild reactive ileitis.  Procedure:  Patient is brought to the operating room and placed in a supine position on the operating room table. Following administration of general anesthesia, a time out was held and the patient's name and procedure is confirmed. Patient is then prepped and draped in the usual strict aseptic fashion.  After ascertaining that an adequate level of anesthesia has been achieved, a peri-umbilical incision is made with a #15 blade. Dissection is carried down to the fascia. Fascia is incised in the midline and the peritoneal cavity is entered cautiously. A #0-vicryl pursestring suture is placed in the fascia. An Hassan cannula is introduced under direct vision and secured with the pursestring suture. The abdomen is insufflated with carbon dioxide. The laparoscope is introduced and the abdomen is explored. Operative ports are placed in the right upper quadrant and left lower quadrant. The appendix is identified. The mesoappendix is divided with the harmonic scalpel. Dissection is carried down to the base of the appendix. The base of the appendix is dissected out clearing the junction with the cecal wall. Using an Endo-GIA stapler, the base of the appendix is transected at the  junction with the cecal wall. There is good approximation of tissue along the staple line. There is good hemostasis along the staple line. The appendix is placed into an endo-catch bag and withdrawn through the umbilical port. The #0-vicryl pursestring suture is tied securely.  Right lower quadrant is irrigated with warm saline which is evacuated. Good hemostasis is noted. Ports are removed under direct vision. Good hemostasis is noted at the port sites. Pneumoperitoneum is released.  Skin incisions are anesthetized with local anesthetic. Wounds are closed with interrupted 4-0 Monocryl subcuticular sutures. Wounds are washed and dried and benzoin and Steri-Strips are applied. Dressings are applied. The patient is awakened from anesthesia and brought to the recovery room. The patient tolerated the procedure well.  Velora Hecklerodd M. Omie Ferger, MD, [facility] Office: [phone]

## 2015-05-08 NOTE — Assessment & Plan Note (Signed)
?  acute appendicitis [facility] ER now

## 2015-05-08 NOTE — ED Notes (Signed)
Pt can go to floor at 17;15

## 2015-05-08 NOTE — ED Notes (Signed)
Pt states abdominal pain, n/v since yesterday at 10 am.  Had hard stool and pain started soon after.  Diarrhea x 1.  Went to primary care today and was told to come here.

## 2015-05-08 NOTE — Anesthesia Preprocedure Evaluation (Signed)
Anesthesia Evaluation  Patient identified by MRN, date of birth, ID band Patient awake    Reviewed: Allergy & Precautions, NPO status , Patient's Chart, lab work & pertinent test results  Airway Mallampati: II  TM Distance: >3 FB Neck ROM: Full    Dental  (+) Teeth Intact   Pulmonary neg pulmonary ROS,    breath sounds clear to auscultation       Cardiovascular negative cardio ROS   Rhythm:Regular Rate:Normal     Neuro/Psych negative neurological ROS  negative psych ROS   GI/Hepatic Neg liver ROS, Nausea and vomiting, diarrhea, abd pain   Endo/Other  negative endocrine ROS  Renal/GU negative Renal ROS  negative genitourinary   Musculoskeletal negative musculoskeletal ROS (+)   Abdominal   Peds negative pediatric ROS (+)  Hematology negative hematology ROS (+)   Anesthesia Other Findings   Reproductive/Obstetrics negative OB ROS                             Anesthesia Physical Anesthesia Plan  ASA: I and emergent  Anesthesia Plan: General   Post-op Pain Management:    Induction: Intravenous  Airway Management Planned: Oral ETT  Additional Equipment:   Intra-op Plan:   Post-operative Plan: Extubation in OR  Informed Consent: I have reviewed the patients History and Physical, chart, labs and discussed the procedure including the risks, benefits and alternatives for the proposed anesthesia with the patient or authorized representative who has indicated his/her understanding and acceptance.   Dental advisory given  Plan Discussed with: CRNA and Surgeon  Anesthesia Plan Comments:         Anesthesia Quick Evaluation

## 2015-05-08 NOTE — ED Provider Notes (Signed)
CSN: [id number]     Arrival date & time 05/08/15  1153 History   First MD Initiated Contact with Patient 05/08/15 1202     Chief Complaint  Patient presents with  . Abdominal Pain  . Emesis     (Consider location/radiation/quality/duration/timing/severity/associated sxs/prior Treatment) HPI Patient has had several days of lower abdominal pain. This is gotten worse over the past day. She has had associated vomiting. Only one episode of loose stool. She was seen by her primary care doctor today and sent to the emergency department for evaluation for appendicitis. She denies urinary symptoms or GU symptoms. History reviewed. No pertinent past medical history. Past Surgical History  Procedure Laterality Date  . Left tm    . Inner ear surgery Left 2014   Family History  Problem Relation Age of Onset  . Family history unknown: Yes   Social History  Substance Use Topics  . Smoking status: Never Smoker   . Smokeless tobacco: Never Used  . Alcohol Use: No   OB History    Gravida Para Term Preterm AB TAB SAB Ectopic Multiple Living   2 2 1 1  0 0 0 0 0 2     Review of Systems  10 Systems reviewed and are negative for acute change except as noted in the HPI.   Allergies  Review of patient's allergies indicates no known allergies.  Home Medications   Prior to Admission medications   Not on File   BP 110/75 mmHg  Pulse 78  Temp(Src) 98.2 F (36.8 C) (Oral)  Resp 18  SpO2 100% Physical Exam  Constitutional: She is oriented to person, place, and time. She appears well-developed and well-nourished.  HENT:  Head: Normocephalic and atraumatic.  Eyes: EOM are normal. Pupils are equal, round, and reactive to light.  Neck: Neck supple.  Cardiovascular: Normal rate, regular rhythm, normal heart sounds and intact distal pulses.   Pulmonary/Chest: Effort normal and breath sounds normal.  Abdominal: Soft. Bowel sounds are normal. She exhibits no distension. There is tenderness.   Severe right lower quadrant tenderness.  Musculoskeletal: Normal range of motion. She exhibits no edema.  Neurological: She is alert and oriented to person, place, and time. She has normal strength. Coordination normal. GCS eye subscore is 4. GCS verbal subscore is 5. GCS motor subscore is 6.  Skin: Skin is warm, dry and intact.  Psychiatric: She has a normal mood and affect.    ED Course  Procedures (including critical care time) Labs Review Labs Reviewed  CBC - Abnormal; Notable for the following:    WBC 14.6 (*)    All other components within normal limits  COMPREHENSIVE METABOLIC PANEL - Abnormal; Notable for the following:    Sodium 134 (*)    Potassium 3.4 (*)    CO2 20 (*)    Glucose, Bld 101 (*)    Calcium 8.3 (*)    Total Bilirubin 2.5 (*)    All other components within normal limits  URINALYSIS, ROUTINE W REFLEX MICROSCOPIC (NOT AT [facility])  LIPASE, BLOOD  I-STAT BETA HCG BLOOD, ED ([facility], [facility], [facility] ONLY)    Imaging Review Ct Abdomen Pelvis W Contrast  05/08/2015  CLINICAL DATA:  Mid abdominal pain starting yesterday at 10 a.m. Vomiting after meals. EXAM: CT ABDOMEN AND PELVIS WITH CONTRAST TECHNIQUE: Multidetector CT imaging of the abdomen and pelvis was performed using the standard protocol following bolus administration of intravenous contrast. CONTRAST:  25mL OMNIPAQUE IOHEXOL 300 MG/ML SOLN, 80mL OMNIPAQUE IOHEXOL 300  MG/ML SOLN COMPARISON:  None. FINDINGS: Lower chest and abdominal wall:  No contributory findings. Hepatobiliary: No focal liver abnormality.No evidence of biliary obstruction or stone. Pancreas: Unremarkable. Spleen: Unremarkable. Adrenals/Urinary Tract: Negative adrenals. No hydronephrosis or stone. Unremarkable bladder. Reproductive:IUD in good position. 34 mm right adnexal cyst, simple. Stomach/Bowel: The appendix is dilated and fluid-filled with thick wall and right lower quadrant inflammation. The tip of the cecum is also inflamed with submucosal edema. No  visible necrosis or perforation. Negative for abscess. Mild reactive appearing ileitis. Vascular/Lymphatic: No acute vascular abnormality. Reactive appearing mesenteric adenitis. Peritoneal: No ascites or pneumoperitoneum. Musculoskeletal: No acute abnormalities. These results were called by telephone at the time of interpretation on 05/08/2015 at 3:06 pm to Dr. Arby Barrette , who verbally acknowledged these results. IMPRESSION: 1. Acute suppurative appendicitis. 2. 34 mm simple right adnexal cyst. Electronically Signed   By: Marnee Spring M.D.   On: 05/08/2015 15:07   I have personally reviewed and evaluated these images and lab results as part of my medical decision-making.   EKG Interpretation None     Consultation: Gen. surgery was consults it. Will be seen in the emergency department. MDM   Final diagnoses:  Acute appendicitis, unspecified acute appendicitis type   Patient is otherwise healthy and presents with right lower quadrant pain. CT study does confirm appendicitis. Patient will be seen in the emergency department by general surgery for management. She is alert and nontoxic.    Arby Barrette, MD 05/08/15 [phone]

## 2015-05-08 NOTE — Progress Notes (Signed)
Pre visit review using our clinic review tool, if applicable. No additional management support is needed unless otherwise documented below in the visit note. 

## 2015-05-08 NOTE — ED Notes (Signed)
Pt made aware of needed urine specimen  

## 2015-05-08 NOTE — Assessment & Plan Note (Signed)
?  acute appendicitis [facility] ER now 

## 2015-05-08 NOTE — Progress Notes (Signed)
Subjective:  Patient ID: Amanda Farley, female    DOB: 04/20/1981  Age: 34 y.o. MRN: [id number]  CC: Abdominal Pain   HPI Amanda Farley presents for abd pain and fiullness, vomiting after meals - lately every 15 min (green mucus). On BCP - no periods - Mirena  Outpatient Prescriptions Prior to Visit  Medication Sig Dispense Refill  . amoxicillin-clavulanate (AUGMENTIN) 875-125 MG tablet Take 1 tablet by mouth 2 (two) times daily. (Patient not taking: Reported on 05/08/2015) 30 tablet 0  . ciprofloxacin-dexamethasone (CIPRODEX) otic suspension Place 4 drops into the left ear 2 (two) times daily. (Patient not taking: Reported on 05/08/2015) 7.5 mL 0  . fluticasone (FLONASE) 50 MCG/ACT nasal spray Place 2 sprays into both nostrils daily. (Patient not taking: Reported on 05/08/2015) 16 g 6   No facility-administered medications prior to visit.    ROS Review of Systems  Constitutional: Positive for chills. Negative for activity change, appetite change, fatigue and unexpected weight change.  HENT: Negative for congestion, mouth sores and sinus pressure.   Eyes: Negative for visual disturbance.  Respiratory: Negative for cough and chest tightness.   Gastrointestinal: Positive for nausea, vomiting, abdominal pain and constipation.  Genitourinary: Positive for decreased urine volume. Negative for dysuria, frequency, difficulty urinating and vaginal pain.  Musculoskeletal: Negative for back pain and gait problem.  Skin: Negative for pallor and rash.  Neurological: Negative for dizziness, tremors, weakness, numbness and headaches.  Psychiatric/Behavioral: Negative for confusion and sleep disturbance. The patient is not nervous/anxious.     Objective:  BP 98/68 mmHg  Pulse 94  Temp(Src) 98.4 F (36.9 C) (Oral)  Wt 105 lb (47.628 kg)  SpO2 98%  BP Readings from Last 3 Encounters:  05/08/15 98/68  04/22/15 98/60  04/09/15 106/72    Wt Readings from Last 3 Encounters:  05/08/15 105 lb  (47.628 kg)  04/22/15 108 lb (48.988 kg)  06/07/14 139 lb (63.05 kg)    Physical Exam  Constitutional: She appears well-developed. No distress.  HENT:  Head: Normocephalic.  Right Ear: External ear normal.  Left Ear: External ear normal.  Nose: Nose normal.  Mouth/Throat: Oropharynx is clear and moist.  Eyes: Conjunctivae are normal. Pupils are equal, round, and reactive to light. Right eye exhibits no discharge. Left eye exhibits no discharge.  Neck: Normal range of motion. Neck supple. No JVD present. No tracheal deviation present. No thyromegaly present.  Cardiovascular: Normal rate, regular rhythm and normal heart sounds.   Pulmonary/Chest: No stridor. No respiratory distress. She has no wheezes.  Abdominal: Soft. Bowel sounds are normal. She exhibits no distension and no mass. There is tenderness. There is rebound and guarding.  Musculoskeletal: She exhibits no edema or tenderness.  Lymphadenopathy:    She has no cervical adenopathy.  Neurological: She displays normal reflexes. No cranial nerve deficit. She exhibits normal muscle tone. Coordination normal.  Skin: No rash noted. No erythema.  Psychiatric: She has a normal mood and affect. Her behavior is normal. Judgment and thought content normal.  pain RLQ Dry oral mucosa  Lab Results  Component Value Date   WBC 13.0* 06/07/2014   HGB 10.6* 06/07/2014   HCT 32.5* 06/07/2014   PLT 171 06/07/2014    No results found.  Assessment & Plan:   There are no diagnoses linked to this encounter. I am having Ms. Frary maintain her amoxicillin-clavulanate, ciprofloxacin-dexamethasone, and fluticasone.  No orders of the defined types were placed in this encounter.     Follow-up: No Follow-up on  file.  Walker Kehr, MD

## 2015-05-08 NOTE — Anesthesia Procedure Notes (Signed)
Procedure Name: Intubation Date/Time: 05/08/2015 9:05 PM Performed by: Illene SilverEVANS, Jessah Danser E Pre-anesthesia Checklist: Patient identified, Emergency Drugs available, Suction available and Patient being monitored Patient Re-evaluated:Patient Re-evaluated prior to inductionOxygen Delivery Method: Circle System Utilized Preoxygenation: Pre-oxygenation with 100% oxygen Intubation Type: IV induction Ventilation: Mask ventilation without difficulty Laryngoscope Size: Mac and 3 Tube type: Oral Tube size: 7.0 mm Number of attempts: 1 Airway Equipment and Method: Stylet and Oral airway Placement Confirmation: ETT inserted through vocal cords under direct vision,  positive ETCO2 and breath sounds checked- equal and bilateral Secured at: 20 cm Tube secured with: Tape Dental Injury: Teeth and Oropharynx as per pre-operative assessment

## 2015-05-08 NOTE — Transfer of Care (Signed)
Immediate Anesthesia Transfer of Care Note  Patient: Amanda Farley  Procedure(s) Performed: Procedure(s): APPENDECTOMY LAPAROSCOPIC (N/A)  Patient Location: PACU  Anesthesia Type:General  Level of Consciousness: awake, alert , oriented and patient cooperative  Airway & Oxygen Therapy: Patient Spontanous Breathing and Patient connected to face mask oxygen  Post-op Assessment: Report given to RN, Post -op Vital signs reviewed and stable and Patient moving all extremities X 4  Post vital signs: stable  Last Vitals:  Filed Vitals:   05/08/15 2211 05/08/15 2215  BP: 98/61 96/52  Pulse: 70 69  Temp: 37.3 C   Resp: 14 14    Complications: No apparent anesthesia complications

## 2015-05-09 MED ORDER — HYDROCODONE-ACETAMINOPHEN 5-325 MG PO TABS: 1.0000 | ORAL_TABLET | ORAL | Status: DC | PRN

## 2015-05-09 MED ORDER — KCL IN DEXTROSE-NACL 20-5-0.45 MEQ/L-%-% IV SOLN
INTRAVENOUS | Status: DC
  Administered 2015-05-08: 75 mL/h via INTRAVENOUS
  Filled 2015-05-09 (×2): qty 1000

## 2015-05-09 NOTE — Progress Notes (Signed)
Pt discharged to home. DC instructions given with female family member at bedside. Prescription x 1 also given for pain. No concerns voiced. Left unit in good condition ambulating downstairs accompanied by female family member. Preferred to walk and not ride in the wheelchair. Vwilliams,rn.

## 2015-05-09 NOTE — Discharge Instructions (Signed)
°  [facility] ° °LAPAROSCOPIC SURGERY:  POST-OP INSTRUCTIONS ° °Always review your discharge instruction sheet given to you by the facility where your surgery was performed. ° °A prescription for pain medication may be given to you upon discharge.  Take your pain medication as prescribed.  If narcotic pain medicine is not needed, then you may take acetaminophen (Tylenol) or ibuprofen (Advil) as needed. ° °Take your usually prescribed medications unless otherwise directed. ° °If you need a refill on your pain medication, please contact your pharmacy.  They will contact our office to request authorization. Prescriptions will not be filled after 5 P.M. or on weekends. ° °You should follow a light diet the first few days after arrival home, such as soup and crackers or toast.  Be sure to include plenty of fluids daily. ° °Most patients will experience some swelling and bruising in the area of the incisions.  Ice packs will help.  Swelling and bruising can take several days to resolve.  ° °It is common to experience some constipation after surgery.  Increasing fluid intake and taking a stool softener (such as Colace) will usually help or prevent this problem from occurring.  A mild laxative (Milk of Magnesia or Miralax) should be taken according to package instructions if there has been no bowel movement after 48 hours. ° °You will have steri-strips and a gauze dressing over your incisions.  You may remove the gauze bandage on the second day after surgery, and you may shower at that time.  Leave your steri-strips (small skin tapes) in place directly over the incision.  These strips should remain on the skin for 5-7 days and then be removed.  You may get them wet in the shower and pat them dry. ° °Any sutures or staples will be removed at the office during your follow-up visit. ° °ACTIVITIES:  You may resume regular (light) daily activities beginning the next day - such as daily self-care, walking,  climbing stairs - gradually increasing activities as tolerated.  You may have sexual intercourse when it is comfortable.  Refrain from any heavy lifting or straining until approved by your doctor. ° °You may drive when you are no longer taking prescription pain medication, you can comfortably wear a seatbelt, and you can safely maneuver your car and apply brakes. ° °You should see your doctor in the office for a follow-up appointment approximately 2-3 weeks after your surgery.  Make sure that you call for this appointment within a day or two after you arrive home to insure a convenient appointment time. ° °WHEN TO CALL YOUR DOCTOR: °1. Fever over 101.0 °2. Inability to urinate °3. Continued bleeding from incision °4. Increased pain, redness, or drainage from the incision °5. Increasing abdominal pain ° °The clinic staff is available to answer your questions during regular business hours.  Please don’t hesitate to call and ask to speak to one of the nurses for clinical concerns.  If you have a medical emergency, go to the nearest emergency room or call 911.  A surgeon from [facility] is always on call for the hospital. ° °Leron Stoffers M. Tykeem Lanzer, MD, FACS °[facility] °Office: [phone] °Toll Free:  [phone] °FAX [phone] ° °Website: [email or web address] °

## 2015-05-09 NOTE — Progress Notes (Signed)
Dr. Johna SheriffHoxworth made aware of pt's desire to go home. Telephone Order given to discharge pt. Vwilliams,rn.

## 2015-05-09 NOTE — Progress Notes (Signed)
Patient ID: Amanda Farley, female   DOB: 01/28/1981, 34 y.o.   MRN: [id number]  General Surgery [facility]  POD#: 1  Subjective: Patient with mild pain, no nausea.  OOB to bathroom.  Taking limited liquids so far.  Objective: Vital signs in last 24 hours: Temp:  [97.9 F (36.6 C)-100 F (37.8 C)] 97.9 F (36.6 C) (12/03 0600) Pulse Rate:  [62-98] 70 (12/03 0600) Resp:  [14-18] 16 (12/03 0600) BP: (83-112)/(47-75) 91/49 mmHg (12/03 0832) SpO2:  [98 %-100 %] 100 % (12/03 0600) Weight:  [47.628 kg (105 lb)] 47.628 kg (105 lb) (12/02 1056)    Intake/Output from previous day: 12/02 0701 - 12/03 0700 In: 2056.3 [I.V.:2006.3; IV Piggyback:50] Out: 225 [Urine:225] Intake/Output this shift:    Physical Exam: HEENT - sclerae clear, mucous membranes moist Neck - soft Chest - clear bilaterally Cor - RRR Abdomen - soft, dressings dry and intact Ext - no edema, non-tender Neuro - alert & oriented, no focal deficits  Lab Results:   Recent Labs  05/08/15 1220  WBC 14.6*  HGB 14.0  HCT 42.2  PLT 203   BMET  Recent Labs  05/08/15 1313  NA 134*  K 3.4*  CL 105  CO2 20*  GLUCOSE 101*  BUN 12  CREATININE 0.56  CALCIUM 8.3*   PT/INR No results for input(s): LABPROT, INR in the last 72 hours. Comprehensive Metabolic Panel:    Component Value Date/Time   NA 134* 05/08/2015 1313   K 3.4* 05/08/2015 1313   CL 105 05/08/2015 1313   CO2 20* 05/08/2015 1313   BUN 12 05/08/2015 1313   CREATININE 0.56 05/08/2015 1313   GLUCOSE 101* 05/08/2015 1313   CALCIUM 8.3* 05/08/2015 1313   AST 18 05/08/2015 1313   ALT 15 05/08/2015 1313   ALKPHOS 42 05/08/2015 1313   BILITOT 2.5* 05/08/2015 1313   PROT 7.6 05/08/2015 1313   ALBUMIN 4.3 05/08/2015 1313    Studies/Results: Ct Abdomen Pelvis W Contrast  05/08/2015  CLINICAL DATA:  Mid abdominal pain starting yesterday at 10 a.m. Vomiting after meals. EXAM: CT ABDOMEN AND PELVIS WITH CONTRAST TECHNIQUE:  Multidetector CT imaging of the abdomen and pelvis was performed using the standard protocol following bolus administration of intravenous contrast. CONTRAST:  25mL OMNIPAQUE IOHEXOL 300 MG/ML SOLN, 80mL OMNIPAQUE IOHEXOL 300 MG/ML SOLN COMPARISON:  None. FINDINGS: Lower chest and abdominal wall:  No contributory findings. Hepatobiliary: No focal liver abnormality.No evidence of biliary obstruction or stone. Pancreas: Unremarkable. Spleen: Unremarkable. Adrenals/Urinary Tract: Negative adrenals. No hydronephrosis or stone. Unremarkable bladder. Reproductive:IUD in good position. 34 mm right adnexal cyst, simple. Stomach/Bowel: The appendix is dilated and fluid-filled with thick wall and right lower quadrant inflammation. The tip of the cecum is also inflamed with submucosal edema. No visible necrosis or perforation. Negative for abscess. Mild reactive appearing ileitis. Vascular/Lymphatic: No acute vascular abnormality. Reactive appearing mesenteric adenitis. Peritoneal: No ascites or pneumoperitoneum. Musculoskeletal: No acute abnormalities. These results were called by telephone at the time of interpretation on 05/08/2015 at 3:06 pm to Dr. Arby BarretteMARCY PFEIFFER , who verbally acknowledged these results. IMPRESSION: 1. Acute suppurative appendicitis. 2. 34 mm simple right adnexal cyst. Electronically Signed   By: Marnee SpringJonathon  Watts M.D.   On: 05/08/2015 15:07    Anti-infectives: Anti-infectives    Start     Dose/Rate Route Frequency Ordered Stop   05/08/15 2200  cefTRIAXone (ROCEPHIN) 2 g in dextrose 5 % 50 mL IVPB     2 g 100  mL/hr over 30 Minutes Intravenous Every 24 hours 05/08/15 1725     05/08/15 2100  metroNIDAZOLE (FLAGYL) IVPB 500 mg     500 mg 100 mL/hr over 60 Minutes Intravenous Every 8 hours 05/08/15 1725     05/08/15 1515  piperacillin-tazobactam (ZOSYN) IVPB 3.375 g     3.375 g 12.5 mL/hr over 240 Minutes Intravenous  Once 05/08/15 1509 05/08/15 1918      Assessment & Plans: Status post lap  appendectomy for acute appendicitis  Advance diet this AM  Encouraged OOB, ambulation  Possibly home after lunch today  Velora Heckler, MD, [facility] Office: [phone]   Amanda Farley Judie Petit 05/09/2015

## 2015-05-10 NOTE — Anesthesia Postprocedure Evaluation (Signed)
Anesthesia Post Note  Patient: Amanda Farley, Amanda Farley  Procedure(s) Performed: Procedure(s) (LRB): APPENDECTOMY LAPAROSCOPIC (N/A)  Patient location during evaluation: PACU Anesthesia Type: General Level of consciousness: awake and alert and sedated Pain management: pain level controlled Vital Signs Assessment: post-procedure vital signs reviewed and stable Respiratory status: spontaneous breathing, nonlabored ventilation, respiratory function stable and patient connected to nasal cannula oxygen Cardiovascular status: blood pressure returned to baseline and stable Postop Assessment: no signs of nausea or vomiting Anesthetic complications: no    Last Vitals:  Filed Vitals:   05/09/15 0832 05/09/15 1400  BP: 91/49 89/56  Pulse:  83  Temp:  36.9 C  Resp:  16    Last Pain:  Filed Vitals:   05/09/15 1702  PainSc: 0-No pain                 Diannie Willner,JAMES TERRILL

## 2015-05-11 ENCOUNTER — Encounter (HOSPITAL_COMMUNITY): Payer: Self-pay | Admitting: Surgery

## 2015-05-12 ENCOUNTER — Encounter (HOSPITAL_COMMUNITY): Payer: Self-pay | Admitting: Surgery

## 2015-05-22 NOTE — Discharge Summary (Signed)
Patient ID: Griselda MinerSreynich Abrams MRN: [id number] DOB/AGE: 36/09/1980 36 y.o.  Admit date: 05/08/2015 Discharge date: 05/22/2015  Procedures: lap appy  Consults: None  Reason for Admission:  this is a 34 yo Asian female who has no medical problems who began having abdominal pain yesterday around 10am. She developed nausea and vomiting, but no fevers or chills. She did have a couple of BMs, but no diarrhea. Her pain persisted and [facility]. Her WBC was 14.5 and a CT of the abd/pel revealed acute suppurative appendicitis. There is some submucosal edema of the cecum and mild reactive ileitis. We have been asked to see her for admission.  Admission Diagnoses: acute appendicitis  Hospital Course:  The patient was admitted and taken to the OR where she underwent a lap appy.  She tolerated this well.  She was tolerating a solid diet and oral pain meds on POD 1 and was otherwise stable for dc home.  Discharge Diagnoses:  Principal Problem:   Acute appendicitis s/p lap appy  Discharge Medications:   Medication List    TAKE these medications        HYDROcodone-acetaminophen 5-325 MG tablet  Commonly known as:  NORCO/VICODIN  Take 1-2 tablets by mouth every 4 (four) hours as needed for moderate pain.        Discharge Instructions:     Follow-up Information    [facility] Schedule an appointment as soon as possible for a visit in 3 weeks.   Specialty:  General Surgery   Why:  For wound re-check   Contact information:   [address] [place] [id number] [phone]      Signed: Letha CapeOSBORNE,Lyriq Finerty E 05/22/2015, 11:22 AM

## 2015-07-02 ENCOUNTER — Ambulatory Visit (INDEPENDENT_AMBULATORY_CARE_PROVIDER_SITE_OTHER): Payer: BLUE CROSS/BLUE SHIELD | Admitting: Internal Medicine

## 2015-07-02 ENCOUNTER — Encounter: Payer: Self-pay | Admitting: Internal Medicine

## 2015-07-02 VITALS — BP 90/68 | HR 71 | Temp 98.5°F | Ht 62.0 in | Wt 104.0 lb

## 2015-07-02 DIAGNOSIS — M26629 Arthralgia of temporomandibular joint, unspecified side: Secondary | ICD-10-CM

## 2015-07-02 MED ORDER — TRAMADOL HCL 50 MG PO TABS: 50.0000 mg | ORAL_TABLET | Freq: Three times a day (TID) | ORAL | Status: DC | PRN

## 2015-07-02 NOTE — Progress Notes (Signed)
   Subjective:    Patient ID: Amanda Farley, female    DOB: [birth year], 35 y.o.   MRN: [id number]  HPI  She describes pain in around the left ear the last week without specific predisposition or exacerbating factor. She describes it as throbbing up to 5-6 level.She's also had some numbness in this area.  She feels as if she's had some chills and has been hot but has not had definite fever.   She grew up in [place]; and as a child she traumatized and probably perforated the left tympanic membrane. She apparently had some otic surgery in [place] in 2015. She has decreased hearing which is chronic and not an acute issue.  She has no other upper respiratory or lower respiratory tract symptoms.  Review of Systems  Frontal headache, facial pain , nasal purulence, dental pain, sore throat , or otic discharge denied. Extrinsic symptoms of itchy, watery eyes, sneezing, or angioedema are denied. There is no significant cough, sputum production, wheezing,or  paroxysmal nocturnal dyspnea.     Objective:   Physical Exam  There is marked scarring of the left tympanic membrane. There is no purulence or erythema. She has decreased hearing to whisper on the left. With mastication maneuvers there is dislocation of the left temporal mandibular joint to the left. There is also associated crepitus. There is tenderness to palpation this area and she states that palpation does elicit her pain.  General appearance:Petite but adequately nourished; no acute distress or increased work of breathing is present.    Lymphatic: No  lymphadenopathy about the head, neck, or axilla .  Eyes: No conjunctival inflammation or lid edema is present. There is no scleral icterus.  Ears:  External ear exam shows no significant lesions or deformities.   Nose:  External nasal examination shows no deformity or inflammation. Nasal mucosa are pink and moist without lesions or exudates No septal dislocation or deviation.No  obstruction to airflow.   Oral exam: Dental hygiene is good; lips and gums are healthy appearing.There is no oropharyngeal erythema or exudate .  Neck:  No deformities, thyromegaly, masses, or tenderness noted.   Supple with full range of motion without pain.   Heart:  Normal rate and regular rhythm. S1 and S2 normal without gallop, murmur, click, rub or other extra sounds.   Lungs:Chest clear to auscultation; no wheezes, rhonchi,rales ,or rubs present.  Extremities:  No cyanosis, edema, or clubbing  noted   Skin: Warm & dry w/o tenting  No significant lesions or rash.      Assessment & Plan:  #1 TMJ dysfunction  #2 chronic hearing loss related to previous otic trauma  Plan: See after visit summary.

## 2015-07-02 NOTE — Progress Notes (Signed)
Pre visit review using our clinic review tool, if applicable. No additional management support is needed unless otherwise documented below in the visit note. 

## 2015-07-02 NOTE — Patient Instructions (Signed)
Soft or liquid diet if having pain at the temporomandibular joint. Avoid tough foods and avoid chewing gum. Use warm moist compresses to 3 times a day over the painful area. Consider glucosamine sulfate 1500 mg daily for the TMJ symptoms. Take this daily  to rehydrate the cartilages. If symptoms persist; please consider evaluation by a Dentist for possible nighttime prosthesis to prevent grinding of teeth. 

## 2015-11-14 ENCOUNTER — Emergency Department (HOSPITAL_COMMUNITY): Payer: BLUE CROSS/BLUE SHIELD

## 2015-11-14 ENCOUNTER — Emergency Department (HOSPITAL_COMMUNITY)
Admission: EM | Admit: 2015-11-14 | Discharge: 2015-11-14 | Disposition: A | Discharge status: Home / Self Care | Payer: BLUE CROSS/BLUE SHIELD | Attending: Emergency Medicine | Admitting: Emergency Medicine

## 2015-11-14 ENCOUNTER — Encounter (HOSPITAL_COMMUNITY): Payer: Self-pay | Admitting: Emergency Medicine

## 2015-11-14 DIAGNOSIS — N83299 Other ovarian cyst, unspecified side: Secondary | ICD-10-CM | POA: Insufficient documentation

## 2015-11-14 DIAGNOSIS — R1030 Lower abdominal pain, unspecified: Secondary | ICD-10-CM | POA: Diagnosis present

## 2015-11-14 DIAGNOSIS — N83209 Unspecified ovarian cyst, unspecified side: Secondary | ICD-10-CM

## 2015-11-14 DIAGNOSIS — A5901 Trichomonal vulvovaginitis: Secondary | ICD-10-CM | POA: Diagnosis not present

## 2015-11-14 DIAGNOSIS — Z79899 Other long term (current) drug therapy: Secondary | ICD-10-CM | POA: Insufficient documentation

## 2015-11-14 DIAGNOSIS — R102 Pelvic and perineal pain: Secondary | ICD-10-CM

## 2015-11-14 LAB — RPR: RPR Ser Ql: NONREACTIVE

## 2015-11-14 LAB — URINALYSIS, ROUTINE W REFLEX MICROSCOPIC
BILIRUBIN URINE: NEGATIVE
Glucose, UA: NEGATIVE mg/dL
HGB URINE DIPSTICK: NEGATIVE
KETONES UR: NEGATIVE mg/dL
Leukocytes, UA: NEGATIVE
Nitrite: NEGATIVE
PROTEIN: NEGATIVE mg/dL
Specific Gravity, Urine: 1.028 (ref 1.005–1.030)
pH: 5.5 (ref 5.0–8.0)

## 2015-11-14 LAB — CBC WITH DIFFERENTIAL/PLATELET
BASOS ABS: 0 10*3/uL (ref 0.0–0.1)
BASOS PCT: 0 %
EOS PCT: 1 %
Eosinophils Absolute: 0.1 10*3/uL (ref 0.0–0.7)
HEMATOCRIT: 36 % (ref 36.0–46.0)
Hemoglobin: 11.8 g/dL — ABNORMAL LOW (ref 12.0–15.0)
Lymphocytes Relative: 39 %
Lymphs Abs: 2.3 10*3/uL (ref 0.7–4.0)
MCH: 27.8 pg (ref 26.0–34.0)
MCHC: 32.8 g/dL (ref 30.0–36.0)
MCV: 84.7 fL (ref 78.0–100.0)
MONO ABS: 0.4 10*3/uL (ref 0.1–1.0)
Monocytes Relative: 7 %
NEUTROS ABS: 3.1 10*3/uL (ref 1.7–7.7)
Neutrophils Relative %: 53 %
PLATELETS: 200 10*3/uL (ref 150–400)
RBC: 4.25 MIL/uL (ref 3.87–5.11)
RDW: 12.8 % (ref 11.5–15.5)
WBC: 5.8 10*3/uL (ref 4.0–10.5)

## 2015-11-14 LAB — WET PREP, GENITAL
Clue Cells Wet Prep HPF POC: NONE SEEN
Sperm: NONE SEEN
YEAST WET PREP: NONE SEEN

## 2015-11-14 LAB — LIPASE, BLOOD: LIPASE: 33 U/L (ref 11–51)

## 2015-11-14 LAB — COMPREHENSIVE METABOLIC PANEL
ALK PHOS: 34 U/L — AB (ref 38–126)
ALT: 19 U/L (ref 14–54)
AST: 19 U/L (ref 15–41)
Albumin: 4.5 g/dL (ref 3.5–5.0)
Anion gap: 8 (ref 5–15)
BILIRUBIN TOTAL: 1.3 mg/dL — AB (ref 0.3–1.2)
BUN: 19 mg/dL (ref 6–20)
CALCIUM: 8.9 mg/dL (ref 8.9–10.3)
CO2: 23 mmol/L (ref 22–32)
CREATININE: 0.66 mg/dL (ref 0.44–1.00)
Chloride: 106 mmol/L (ref 101–111)
GFR calc Af Amer: >60 mL/min (ref >60)
Glucose, Bld: 96 mg/dL (ref 65–99)
POTASSIUM: 3.7 mmol/L (ref 3.5–5.1)
Sodium: 137 mmol/L (ref 135–145)
TOTAL PROTEIN: 7.4 g/dL (ref 6.5–8.1)

## 2015-11-14 LAB — I-STAT BETA HCG BLOOD, ED (MC, WL, AP ONLY)

## 2015-11-14 LAB — HIV ANTIBODY (ROUTINE TESTING W REFLEX): HIV Screen 4th Generation wRfx: NONREACTIVE

## 2015-11-14 LAB — I-STAT CG4 LACTIC ACID, ED: LACTIC ACID, VENOUS: 1.21 mmol/L (ref 0.5–2.0)

## 2015-11-14 MED ORDER — SODIUM CHLORIDE 0.9 % IV SOLN
1000.0000 mL | Freq: Once | INTRAVENOUS | Status: AC
  Administered 2015-11-14: 1000 mL via INTRAVENOUS

## 2015-11-14 MED ORDER — SODIUM CHLORIDE 0.9 % IV SOLN: 1000.0000 mL | INTRAVENOUS | Status: DC

## 2015-11-14 MED ORDER — METRONIDAZOLE 500 MG PO TABS
2000.0000 mg | ORAL_TABLET | Freq: Once | ORAL | Status: AC
  Administered 2015-11-14: 2000 mg via ORAL
  Filled 2015-11-14: qty 4

## 2015-11-14 MED ORDER — MORPHINE SULFATE (PF) 4 MG/ML IV SOLN
4.0000 mg | Freq: Once | INTRAVENOUS | Status: AC
  Administered 2015-11-14: 4 mg via INTRAVENOUS
  Filled 2015-11-14: qty 1

## 2015-11-14 MED ORDER — ONDANSETRON HCL 4 MG/2ML IJ SOLN
4.0000 mg | Freq: Once | INTRAMUSCULAR | Status: AC
  Administered 2015-11-14: 4 mg via INTRAVENOUS
  Filled 2015-11-14: qty 2

## 2015-11-14 MED ORDER — AZITHROMYCIN 1 G PO PACK
1.0000 g | PACK | Freq: Once | ORAL | Status: AC
  Administered 2015-11-14: 1 g via ORAL
  Filled 2015-11-14: qty 1

## 2015-11-14 MED ORDER — DOXYCYCLINE HYCLATE 100 MG PO TABS
100.0000 mg | ORAL_TABLET | Freq: Once | ORAL | Status: AC
  Administered 2015-11-14: 100 mg via ORAL
  Filled 2015-11-14: qty 1

## 2015-11-14 MED ORDER — CEFTRIAXONE SODIUM 250 MG IJ SOLR
250.0000 mg | Freq: Once | INTRAMUSCULAR | Status: AC
  Administered 2015-11-14: 250 mg via INTRAMUSCULAR
  Filled 2015-11-14: qty 250

## 2015-11-14 MED ORDER — LIDOCAINE HCL 1 % IJ SOLN
INTRAMUSCULAR | Status: AC
  Administered 2015-11-14: 2 mL
  Filled 2015-11-14: qty 20

## 2015-11-14 MED ORDER — OXYCODONE-ACETAMINOPHEN 5-325 MG PO TABS: 1.0000 | ORAL_TABLET | ORAL | Status: DC | PRN

## 2015-11-14 MED ORDER — DOXYCYCLINE HYCLATE 100 MG PO CAPS: 100.0000 mg | ORAL_CAPSULE | Freq: Two times a day (BID) | ORAL | Status: DC

## 2015-11-14 NOTE — Discharge Instructions (Signed)
Your sexual partner should be treated with metronidazole (Flagyl) 2000 mg single dose. Do not have sexual relations until he has been treated. You had tests for other sexually transmitted diseases - if they are positive, we will call you.  Ovarian Cyst An ovarian cyst is a fluid-filled sac that forms on an ovary. The ovaries are small organs that produce eggs in women. Various types of cysts can form on the ovaries. Most are not cancerous. Many do not cause problems, and they often go away on their own. Some may cause symptoms and require treatment. Common types of ovarian cysts include:  Functional cysts--These cysts may occur every month during the menstrual cycle. This is normal. The cysts usually go away with the next menstrual cycle if the woman does not get pregnant. Usually, there are no symptoms with a functional cyst.  Endometrioma cysts--These cysts form from the tissue that lines the uterus. They are also called "chocolate cysts" because they become filled with blood that turns brown. This type of cyst can cause pain in the lower abdomen during intercourse and with your menstrual period.  Cystadenoma cysts--This type develops from the cells on the outside of the ovary. These cysts can get very big and cause lower abdomen pain and pain with intercourse. This type of cyst can twist on itself, cut off its blood supply, and cause severe pain. It can also easily rupture and cause a lot of pain.  Dermoid cysts--This type of cyst is sometimes found in both ovaries. These cysts may contain different kinds of body tissue, such as skin, teeth, hair, or cartilage. They usually do not cause symptoms unless they get very big.  Theca lutein cysts--These cysts occur when too much of a certain hormone (human chorionic gonadotropin) is produced and overstimulates the ovaries to produce an egg. This is most common after procedures used to assist with the conception of a baby (in vitro fertilization). CAUSES     Fertility drugs can cause a condition in which multiple large cysts are formed on the ovaries. This is called ovarian hyperstimulation syndrome.  A condition called polycystic ovary syndrome can cause hormonal imbalances that can lead to nonfunctional ovarian cysts. SIGNS AND SYMPTOMS  Many ovarian cysts do not cause symptoms. If symptoms are present, they may include:  Pelvic pain or pressure.  Pain in the lower abdomen.  Pain during sexual intercourse.  Increasing girth (swelling) of the abdomen.  Abnormal menstrual periods.  Increasing pain with menstrual periods.  Stopping having menstrual periods without being pregnant. DIAGNOSIS  These cysts are commonly found during a routine or annual pelvic exam. Tests may be ordered to find out more about the cyst. These tests may include:  Ultrasound.  X-ray of the pelvis.  CT scan.  MRI.  Blood tests. TREATMENT  Many ovarian cysts go away on their own without treatment. Your health care provider may want to check your cyst regularly for 2-3 months to see if it changes. For women in menopause, it is particularly important to monitor a cyst closely because of the higher rate of ovarian cancer in menopausal women. When treatment is needed, it may include any of the following:  A procedure to drain the cyst (aspiration). This may be done using a long needle and ultrasound. It can also be done through a laparoscopic procedure. This involves using a thin, lighted tube with a tiny camera on the end (laparoscope) inserted through a small incision.  Surgery to remove the whole cyst. This may  be done using laparoscopic surgery or an open surgery involving a larger incision in the lower abdomen.  Hormone treatment or birth control pills. These methods are sometimes used to help dissolve a cyst. HOME CARE INSTRUCTIONS   Only take over-the-counter or prescription medicines as directed by your health care provider.  Follow up with your  health care provider as directed.  Get regular pelvic exams and Pap tests. SEEK MEDICAL CARE IF:   Your periods are late, irregular, or painful, or they stop.  Your pelvic pain or abdominal pain does not go away.  Your abdomen becomes larger or swollen.  You have pressure on your bladder or trouble emptying your bladder completely.  You have pain during sexual intercourse.  You have feelings of fullness, pressure, or discomfort in your stomach.  You lose weight for no apparent reason.  You feel generally ill.  You become constipated.  You lose your appetite.  You develop acne.  You have an increase in body and facial hair.  You are gaining weight, without changing your exercise and eating habits.  You think you are pregnant. SEEK IMMEDIATE MEDICAL CARE IF:   You have increasing abdominal pain.  You feel sick to your stomach (nauseous), and you throw up (vomit).  You develop a fever that comes on suddenly.  You have abdominal pain during a bowel movement.  Your menstrual periods become heavier than usual. MAKE SURE YOU:  Understand these instructions.  Will watch your condition.  Will get help right away if you are not doing well or get worse.   This information is not intended to replace advice given to you by your health care provider. Make sure you discuss any questions you have with your health care provider.   Document Released: 05/23/2005 Document Revised: 05/28/2013 Document Reviewed: 01/28/2013 Elsevier Interactive Patient Education 2016 [employer].   Trichomoniasis Trichomoniasis is an infection caused by an organism called Trichomonas. The infection can affect both women and men. In women, the outer female genitalia and the vagina are affected. In men, the penis is mainly affected, but the prostate and other reproductive organs can also be involved. Trichomoniasis is a sexually transmitted infection (STI) and is most often passed to another person  through sexual contact.  RISK FACTORS  Having unprotected sexual intercourse.  Having sexual intercourse with an infected partner. SIGNS AND SYMPTOMS  Symptoms of trichomoniasis in women include:  Abnormal gray-green frothy vaginal discharge.  Itching and irritation of the vagina.  Itching and irritation of the area outside the vagina. Symptoms of trichomoniasis in men include:   Penile discharge with or without pain.  Pain during urination. This results from inflammation of the urethra. DIAGNOSIS  Trichomoniasis may be found during a Pap test or physical exam. Your health care provider may use one of the following methods to help diagnose this infection:  Testing the pH of the vagina with a test tape.  Using a vaginal swab test that checks for the Trichomonas organism. A test is available that provides results within a few minutes.  Examining a urine sample.  Testing vaginal secretions. Your health care provider may test you for other STIs, including HIV. TREATMENT   You may be given medicine to fight the infection. Women should inform their health care provider if they could be or are pregnant. Some medicines used to treat the infection should not be taken during pregnancy.  Your health care provider may recommend over-the-counter medicines or creams to decrease itching or irritation.  Your sexual partner will need to be treated if infected.  Your health care provider may test you for infection again 3 months after treatment. HOME CARE INSTRUCTIONS   Take medicines only as directed by your health care provider.  Take over-the-counter medicine for itching or irritation as directed by your health care provider.  Do not have sexual intercourse while you have the infection.  Women should not douche or wear tampons while they have the infection.  Discuss your infection with your partner. Your partner may have gotten the infection from you, or you may have gotten it from  your partner.  Have your sex partner get examined and treated if necessary.  Practice safe, informed, and protected sex.  See your health care provider for other STI testing. SEEK MEDICAL CARE IF:   You still have symptoms after you finish your medicine.  You develop abdominal pain.  You have pain when you urinate.  You have bleeding after sexual intercourse.  You develop a rash.  Your medicine makes you sick or makes you throw up (vomit). MAKE SURE YOU:  Understand these instructions.  Will watch your condition.  Will get help right away if you are not doing well or get worse.   This information is not intended to replace advice given to you by your health care provider. Make sure you discuss any questions you have with your health care provider.   Document Released: 11/16/2000 Document Revised: 06/13/2014 Document Reviewed: 03/04/2013 Elsevier Interactive Patient Education 2016 [employer].  Doxycycline tablets or capsules What is this medicine? DOXYCYCLINE (dox i SYE kleen) is a tetracycline antibiotic. It kills certain bacteria or stops their growth. It is used to treat many kinds of infections, like dental, skin, respiratory, and urinary tract infections. It also treats acne, Lyme disease, malaria, and certain sexually transmitted infections. This medicine may be used for other purposes; ask your health care provider or pharmacist if you have questions. What should I tell my health care provider before I take this medicine? They need to know if you have any of these conditions: -liver disease -long exposure to sunlight like working outdoors -stomach problems like colitis -an unusual or allergic reaction to doxycycline, tetracycline antibiotics, other medicines, foods, dyes, or preservatives -pregnant or trying to get pregnant -breast-feeding How should I use this medicine? Take this medicine by mouth with a full glass of water. Follow the directions on the  prescription label. It is best to take this medicine without food, but if it upsets your stomach take it with food. Take your medicine at regular intervals. Do not take your medicine more often than directed. Take all of your medicine as directed even if you think you are better. Do not skip doses or stop your medicine early. Talk to your pediatrician regarding the use of this medicine in children. While this drug may be prescribed for selected conditions, precautions do apply. Overdosage: If you think you have taken too much of this medicine contact a poison control center or emergency room at once. NOTE: This medicine is only for you. Do not share this medicine with others. What if I miss a dose? If you miss a dose, take it as soon as you can. If it is almost time for your next dose, take only that dose. Do not take double or extra doses. What may interact with this medicine? -antacids -barbiturates -birth control pills -bismuth subsalicylate -carbamazepine -methoxyflurane -other antibiotics -phenytoin -vitamins that contain iron -warfarin This list may not  describe all possible interactions. Give your health care provider a list of all the medicines, herbs, non-prescription drugs, or dietary supplements you use. Also tell them if you smoke, drink alcohol, or use illegal drugs. Some items may interact with your medicine. What should I watch for while using this medicine? Tell your doctor or health care professional if your symptoms do not improve. Do not treat diarrhea with over the counter products. Contact your doctor if you have diarrhea that lasts more than 2 days or if it is severe and watery. Do not take this medicine just before going to bed. It may not dissolve properly when you lay down and can cause pain in your throat. Drink plenty of fluids while taking this medicine to also help reduce irritation in your throat. This medicine can make you more sensitive to the sun. Keep out of  the sun. If you cannot avoid being in the sun, wear protective clothing and use sunscreen. Do not use sun lamps or tanning beds/booths. Birth control pills may not work properly while you are taking this medicine. Talk to your doctor about using an extra method of birth control. If you are being treated for a sexually transmitted infection, avoid sexual contact until you have finished your treatment. Your sexual partner may also need treatment. Avoid antacids, aluminum, calcium, magnesium, and iron products for 4 hours before and 2 hours after taking a dose of this medicine. If you are using this medicine to prevent malaria, you should still protect yourself from contact with mosquitos. Stay in screened-in areas, use mosquito nets, keep your body covered, and use an insect repellent. What side effects may I notice from receiving this medicine? Side effects that you should report to your doctor or health care professional as soon as possible: -allergic reactions like skin rash, itching or hives, swelling of the face, lips, or tongue -difficulty breathing -fever -itching in the rectal or genital area -pain on swallowing -redness, blistering, peeling or loosening of the skin, including inside the mouth -severe stomach pain or cramps -unusual bleeding or bruising -unusually weak or tired -yellowing of the eyes or skin Side effects that usually do not require medical attention (report to your doctor or health care professional if they continue or are bothersome): -diarrhea -loss of appetite -nausea, vomiting This list may not describe all possible side effects. Call your doctor for medical advice about side effects. You may report side effects to FDA at 1-800-FDA-1088. Where should I keep my medicine? Keep out of the reach of children. Store at room temperature, below 30 degrees C (86 degrees F). Protect from light. Keep container tightly closed. Throw away any unused medicine after the expiration  date. Taking this medicine after the expiration date can make you seriously ill. NOTE: This sheet is a summary. It may not cover all possible information. If you have questions about this medicine, talk to your doctor, pharmacist, or health care provider.    2016, Elsevier/Gold Standard. (2014-09-12 12:10:28)  Acetaminophen; Oxycodone tablets What is this medicine? ACETAMINOPHEN; OXYCODONE (a set a MEE noe fen; ox i KOE done) is a pain reliever. It is used to treat moderate to severe pain. This medicine may be used for other purposes; ask your health care provider or pharmacist if you have questions. What should I tell my health care provider before I take this medicine? They need to know if you have any of these conditions: -brain tumor -Crohn's disease, inflammatory bowel disease, or ulcerative colitis -drug abuse  or addiction -head injury -heart or circulation problems -if you often drink alcohol -kidney disease or problems going to the bathroom -liver disease -lung disease, asthma, or breathing problems -an unusual or allergic reaction to acetaminophen, oxycodone, other opioid analgesics, other medicines, foods, dyes, or preservatives -pregnant or trying to get pregnant -breast-feeding How should I use this medicine? Take this medicine by mouth with a full glass of water. Follow the directions on the prescription label. You can take it with or without food. If it upsets your stomach, take it with food. Take your medicine at regular intervals. Do not take it more often than directed. Talk to your pediatrician regarding the use of this medicine in children. Special care may be needed. Patients over 34 years old may have a stronger reaction and need a smaller dose. Overdosage: If you think you have taken too much of this medicine contact a poison control center or emergency room at once. NOTE: This medicine is only for you. Do not share this medicine with others. What if I miss a  dose? If you miss a dose, take it as soon as you can. If it is almost time for your next dose, take only that dose. Do not take double or extra doses. What may interact with this medicine? -alcohol -antihistamines -barbiturates like amobarbital, butalbital, butabarbital, methohexital, pentobarbital, phenobarbital, thiopental, and secobarbital -benztropine -drugs for bladder problems like solifenacin, trospium, oxybutynin, tolterodine, hyoscyamine, and methscopolamine -drugs for breathing problems like ipratropium and tiotropium -drugs for certain stomach or intestine problems like propantheline, homatropine methylbromide, glycopyrrolate, atropine, belladonna, and dicyclomine -general anesthetics like etomidate, ketamine, nitrous oxide, propofol, desflurane, enflurane, halothane, isoflurane, and sevoflurane -medicines for depression, anxiety, or psychotic disturbances -medicines for sleep -muscle relaxants -naltrexone -narcotic medicines (opiates) for pain -phenothiazines like perphenazine, thioridazine, chlorpromazine, mesoridazine, fluphenazine, prochlorperazine, promazine, and trifluoperazine -scopolamine -tramadol -trihexyphenidyl This list may not describe all possible interactions. Give your health care provider a list of all the medicines, herbs, non-prescription drugs, or dietary supplements you use. Also tell them if you smoke, drink alcohol, or use illegal drugs. Some items may interact with your medicine. What should I watch for while using this medicine? Tell your doctor or health care professional if your pain does not go away, if it gets worse, or if you have new or a different type of pain. You may develop tolerance to the medicine. Tolerance means that you will need a higher dose of the medication for pain relief. Tolerance is normal and is expected if you take this medicine for a long time. Do not suddenly stop taking your medicine because you may develop a severe reaction.  Your body becomes used to the medicine. This does NOT mean you are addicted. Addiction is a behavior related to getting and using a drug for a non-medical reason. If you have pain, you have a medical reason to take pain medicine. Your doctor will tell you how much medicine to take. If your doctor wants you to stop the medicine, the dose will be slowly lowered over time to avoid any side effects. You may get drowsy or dizzy. Do not drive, use machinery, or do anything that needs mental alertness until you know how this medicine affects you. Do not stand or sit up quickly, especially if you are an older patient. This reduces the risk of dizzy or fainting spells. Alcohol may interfere with the effect of this medicine. Avoid alcoholic drinks. There are different types of narcotic medicines (opiates) for pain. If you  take more than one type at the same time, you may have more side effects. Give your health care provider a list of all medicines you use. Your doctor will tell you how much medicine to take. Do not take more medicine than directed. Call emergency for help if you have problems breathing. The medicine will cause constipation. Try to have a bowel movement at least every 2 to 3 days. If you do not have a bowel movement for 3 days, call your doctor or health care professional. Do not take Tylenol (acetaminophen) or medicines that have acetaminophen with this medicine. Too much acetaminophen can be very dangerous. Many nonprescription medicines contain acetaminophen. Always read the labels carefully to avoid taking more acetaminophen. What side effects may I notice from receiving this medicine? Side effects that you should report to your doctor or health care professional as soon as possible: -allergic reactions like skin rash, itching or hives, swelling of the face, lips, or tongue -breathing difficulties, wheezing -confusion -light headedness or fainting spells -severe stomach pain -unusually weak or  tired -yellowing of the skin or the whites of the eyes Side effects that usually do not require medical attention (report to your doctor or health care professional if they continue or are bothersome): -dizziness -drowsiness -nausea -vomiting This list may not describe all possible side effects. Call your doctor for medical advice about side effects. You may report side effects to FDA at 1-800-FDA-1088. Where should I keep my medicine? Keep out of the reach of children. This medicine can be abused. Keep your medicine in a safe place to protect it from theft. Do not share this medicine with anyone. Selling or giving away this medicine is dangerous and against the law. This medicine may cause accidental overdose and death if it taken by other adults, children, or pets. Mix any unused medicine with a substance like cat litter or coffee grounds. Then throw the medicine away in a sealed container like a sealed bag or a coffee can with a lid. Do not use the medicine after the expiration date. Store at room temperature between 20 and 25 degrees C (68 and 77 degrees F). NOTE: This sheet is a summary. It may not cover all possible information. If you have questions about this medicine, talk to your doctor, pharmacist, or health care provider.    2016, Elsevier/Gold Standard. (2014-04-23 15:18:46)

## 2015-11-14 NOTE — ED Notes (Signed)
Pt c/o mid low abd pain onset 0330, emesis x 1, denies diarrhea.

## 2015-11-14 NOTE — ED Provider Notes (Signed)
CSN: [id number]     Arrival date & time 11/14/15  4098 History   First MD Initiated Contact with Patient 11/14/15 [phone]     Chief Complaint  Patient presents with  . Abdominal Pain     (Consider location/radiation/quality/duration/timing/severity/associated sxs/prior Treatment) Patient is a 35 y.o. female presenting with abdominal pain. The history is provided by the patient.  Abdominal Pain She had onset about 3 PM of low abdominal pain which got much worse at 3:30 AM. Pain is worse with any movement and she rates at 10/10. There is no radiation of pain. There has been nausea and she has vomited once. She has had some dysuria and urinary urgency but no frequency. She has not any diarrhea. She does not have menses because of her injectable contraceptive. She is status post appendectomy.  History reviewed. No pertinent past medical history. Past Surgical History  Procedure Laterality Date  . Left tm    . Inner ear surgery Left 2014  . Laparoscopic appendectomy N/A 05/08/2015    Procedure: APPENDECTOMY LAPAROSCOPIC;  Surgeon: Darnell Level, MD;  Location: [facility];  Service: General;  Laterality: N/A;   Family History  Problem Relation Age of Onset  . Family history unknown: Yes   Social History  Substance Use Topics  . Smoking status: Never Smoker   . Smokeless tobacco: Never Used  . Alcohol Use: No   OB History    Gravida Para Term Preterm AB TAB SAB Ectopic Multiple Living   2 2 1 1  0 0 0 0 0 2     Review of Systems  Gastrointestinal: Positive for abdominal pain.  All other systems reviewed and are negative.     Allergies  Review of patient's allergies indicates no known allergies.  Home Medications   Prior to Admission medications   Medication Sig Start Date End Date Taking? Authorizing Provider  traMADol (ULTRAM) 50 MG tablet Take 1 tablet (50 mg total) by mouth every 8 (eight) hours as needed. 07/02/15   Pecola Lawless, MD   BP 107/69 mmHg  Pulse 75  Temp(Src)  98.2 F (36.8 C) (Oral)  Resp 16  Ht 5\' 2"  (1.575 m)  Wt 105 lb (47.628 kg)  BMI 19.20 kg/m2  SpO2 100% Physical Exam  Nursing note and vitals reviewed.  35 year old female, resting comfortably and in no acute distress. Vital signs are normal. Oxygen saturation is 100%, which is normal. Head is normocephalic and atraumatic. PERRLA, EOMI. Oropharynx is clear. Neck is nontender and supple without adenopathy or JVD. Back is nontender and there is no CVA tenderness. Lungs are clear without rales, wheezes, or rhonchi. Chest is nontender. Heart has regular rate and rhythm without murmur. Abdomen is soft, flat, with tenderness across the lower abdomen with maximum tenderness in the midline in the suprapubic area. There is no rebound or guarding. There are no masses or hepatosplenomegaly and peristalsis is hypoactive. Pelvic: Normal external female genitalia. Cervix is closed. No discharge present. Fundus is top normal in size and moderately tender. There is moderate cervical motion tenderness. There is mild right adnexal tenderness and moderate left adnexal tenderness. No adnexal masses are felt. Extremities have no cyanosis or edema, full range of motion is present. Skin is warm and dry without rash. Neurologic: Mental status is normal, cranial nerves are intact, there are no motor or sensory deficits.  ED Course  Procedures (including critical care time) Labs Review Results for orders placed or performed during the hospital encounter of 11/14/15  Wet prep, genital  Result Value Ref Range   Yeast Wet Prep HPF POC NONE SEEN NONE SEEN   Trich, Wet Prep PRESENT (A) NONE SEEN   Clue Cells Wet Prep HPF POC NONE SEEN NONE SEEN   WBC, Wet Prep HPF POC MANY (A) NONE SEEN   Sperm NONE SEEN   Comprehensive metabolic panel  Result Value Ref Range   Sodium 137 135 - 145 mmol/L   Potassium 3.7 3.5 - 5.1 mmol/L   Chloride 106 101 - 111 mmol/L   CO2 23 22 - 32 mmol/L   Glucose, Bld 96 65 - 99  mg/dL   BUN 19 6 - 20 mg/dL   Creatinine, Ser 1.61 0.44 - 1.00 mg/dL   Calcium 8.9 8.9 - 09.6 mg/dL   Total Protein 7.4 6.5 - 8.1 g/dL   Albumin 4.5 3.5 - 5.0 g/dL   AST 19 15 - 41 U/L   ALT 19 14 - 54 U/L   Alkaline Phosphatase 34 (L) 38 - 126 U/L   Total Bilirubin 1.3 (H) 0.3 - 1.2 mg/dL   GFR calc non Af Amer >60 >60 mL/min   GFR calc Af Amer >60 >60 mL/min   Anion gap 8 5 - 15  Lipase, blood  Result Value Ref Range   Lipase 33 11 - 51 U/L  CBC with Differential  Result Value Ref Range   WBC 5.8 4.0 - 10.5 K/uL   RBC 4.25 3.87 - 5.11 MIL/uL   Hemoglobin 11.8 (L) 12.0 - 15.0 g/dL   HCT 04.5 40.9 - 81.1 %   MCV 84.7 78.0 - 100.0 fL   MCH 27.8 26.0 - 34.0 pg   MCHC 32.8 30.0 - 36.0 g/dL   RDW 91.4 78.2 - 95.6 %   Platelets 200 150 - 400 K/uL   Neutrophils Relative % 53 %   Neutro Abs 3.1 1.7 - 7.7 K/uL   Lymphocytes Relative 39 %   Lymphs Abs 2.3 0.7 - 4.0 K/uL   Monocytes Relative 7 %   Monocytes Absolute 0.4 0.1 - 1.0 K/uL   Eosinophils Relative 1 %   Eosinophils Absolute 0.1 0.0 - 0.7 K/uL   Basophils Relative 0 %   Basophils Absolute 0.0 0.0 - 0.1 K/uL  Urinalysis, Routine w reflex microscopic  Result Value Ref Range   Color, Urine YELLOW YELLOW   APPearance CLEAR CLEAR   Specific Gravity, Urine 1.028 1.005 - 1.030   pH 5.5 5.0 - 8.0   Glucose, UA NEGATIVE NEGATIVE mg/dL   Hgb urine dipstick NEGATIVE NEGATIVE   Bilirubin Urine NEGATIVE NEGATIVE   Ketones, ur NEGATIVE NEGATIVE mg/dL   Protein, ur NEGATIVE NEGATIVE mg/dL   Nitrite NEGATIVE NEGATIVE   Leukocytes, UA NEGATIVE NEGATIVE  I-Stat beta hCG blood, ED  Result Value Ref Range   I-stat hCG, quantitative <5.0 <5 mIU/mL   Comment 3          I-Stat CG4 Lactic Acid, ED  Result Value Ref Range   Lactic Acid, Venous 1.21 0.5 - 2.0 mmol/L   Imaging Review US Transvaginal Non-ob  11/14/2015  CLINICAL DATA:  Acute onset of pelvic pain.  Initial encounter. EXAM: TRANSABDOMINAL AND TRANSVAGINAL ULTRASOUND OF  PELVIS TECHNIQUE: Both transabdominal and transvaginal ultrasound examinations of the pelvis were performed. Transabdominal technique was performed for global imaging of the pelvis including uterus, ovaries, adnexal regions, and pelvic cul-de-sac. It was necessary to proceed with endovaginal exam following the transabdominal exam to visualize the uterus and ovaries in  greater detail. COMPARISON:  None FINDINGS: Uterus Measurements: 5.6 x 4.6 x 5.0 cm. No fibroids or other mass visualized. Endometrium Thickness: 0.9 cm. An intrauterine device is noted in expected position. Fluid is noted within the endometrial canal. Right ovary Measurements: 3.6 x 2.3 x 2.8 cm. Normal appearance/no adnexal mass. Left ovary Measurements: 2.2 x 1.9 x 1.9 cm. Normal appearance/no adnexal mass. Other findings A moderate amount of complex fluid is noted at the left adnexa. This could reflect a ruptured hemorrhagic cyst. IMPRESSION: 1. Moderate amount of complex fluid at the left adnexa. This could reflect a ruptured hemorrhagic cyst. 2. Otherwise unremarkable pelvic ultrasound. Intrauterine device noted in expected position. Small amount of fluid noted within the endometrial canal. 3. No evidence for ovarian torsion. Electronically Signed   By: Roanna RaiderJeffery  Chang M.D.   On: 11/14/2015 06:27   [place] Pelvis Complete  11/14/2015  CLINICAL DATA:  Acute onset of pelvic pain.  Initial encounter. EXAM: TRANSABDOMINAL AND TRANSVAGINAL ULTRASOUND OF PELVIS TECHNIQUE: Both transabdominal and transvaginal ultrasound examinations of the pelvis were performed. Transabdominal technique was performed for global imaging of the pelvis including uterus, ovaries, adnexal regions, and pelvic cul-de-sac. It was necessary to proceed with endovaginal exam following the transabdominal exam to visualize the uterus and ovaries in greater detail. COMPARISON:  None FINDINGS: Uterus Measurements: 5.6 x 4.6 x 5.0 cm. No fibroids or other mass visualized. Endometrium  Thickness: 0.9 cm. An intrauterine device is noted in expected position. Fluid is noted within the endometrial canal. Right ovary Measurements: 3.6 x 2.3 x 2.8 cm. Normal appearance/no adnexal mass. Left ovary Measurements: 2.2 x 1.9 x 1.9 cm. Normal appearance/no adnexal mass. Other findings A moderate amount of complex fluid is noted at the left adnexa. This could reflect a ruptured hemorrhagic cyst. IMPRESSION: 1. Moderate amount of complex fluid at the left adnexa. This could reflect a ruptured hemorrhagic cyst. 2. Otherwise unremarkable pelvic ultrasound. Intrauterine device noted in expected position. Small amount of fluid noted within the endometrial canal. 3. No evidence for ovarian torsion. Electronically Signed   By: Roanna RaiderJeffery  Chang M.D.   On: 11/14/2015 06:27   I have personally reviewed and evaluated these images and lab results as part of my medical decision-making.   MDM   Final diagnoses:  Acute pelvic pain, female  Trichomonal vaginitis  Ruptured ovarian cyst    Lower abdominal pain. Differential includes ovarian cyst, pelvic inflammatory disease, diverticulitis, urinary tract infection. Pelvic exam all be done and screening labs are obtained including urinalysis. Decision on imaging will be deferred until after pelvic exam. Old records are reviewed confirming appendectomy having been done last December.  Pelvic exam shows significant tenderness. She will be sent for pelvic ultrasound. Wet prep does show trichomonas making pelvic inflammatory disease very likely the cause of her pain. She is given a dose of ceftriaxone and azithromycin as well as a dose of metronidazole and doxycycline.  Ultrasound shows fluid in the left adnexa which is her area of maximum tenderness. This certainly could be from a ruptured ovarian cyst. It is possible that all of her pain is from a ruptured cyst and not elevated inflammatory disease. However, it is decided to continue empiric treatment for pelvic  inflammatory disease to try to prevent sequelae of same. She is advised to have sexual partners treated for trichomonas and is discharged with prescription for doxycycline. Also given a prescription for oxycodone have acetaminophen for pain. Told to use acetaminophen or ibuprofen for less severe pain. Follow-up with  PCP in 10 days to evaluate response to treatment.   Dione Booze, MD 11/14/15 [phone]

## 2015-11-16 LAB — GC/CHLAMYDIA PROBE AMP (~~LOC~~) NOT AT ARMC
CHLAMYDIA, DNA PROBE: NEGATIVE
NEISSERIA GONORRHEA: NEGATIVE

## 2015-11-26 ENCOUNTER — Ambulatory Visit (INDEPENDENT_AMBULATORY_CARE_PROVIDER_SITE_OTHER): Payer: BLUE CROSS/BLUE SHIELD | Admitting: Internal Medicine

## 2015-11-26 ENCOUNTER — Encounter: Payer: Self-pay | Admitting: Internal Medicine

## 2015-11-26 VITALS — BP 100/64 | HR 68 | Temp 98.5°F | Resp 18 | Ht 62.0 in | Wt 105.0 lb

## 2015-11-26 DIAGNOSIS — N83209 Unspecified ovarian cyst, unspecified side: Secondary | ICD-10-CM

## 2015-11-26 NOTE — Assessment & Plan Note (Signed)
Talked with her about the etiology of ovarian cysts and the chance for recurrence. Appears to be clear at this time. Advised to follow up with her ob/gyn for recurrence.

## 2015-11-26 NOTE — Progress Notes (Signed)
Pre visit review using our clinic review tool, if applicable. No additional management support is needed unless otherwise documented below in the visit note. 

## 2015-11-26 NOTE — Progress Notes (Signed)
   Subjective:    Patient ID: Amanda Farley, female    DOB: 03/20/1981, 35 y.o.   MRN: [id number]  HPI The patient is a 35 YO female coming in for follow up of an ER visit for ovarian cyst and infection. She has finished her antibiotics and is feeling well. She is not sure of the ovarian cyst and what that means. She does not know if it can come back. No pain or burning with urination.   Review of Systems  Constitutional: Negative for fever, chills, activity change, appetite change, fatigue and unexpected weight change.  HENT: Positive for hearing loss. Negative for facial swelling, postnasal drip, sinus pressure, sore throat and trouble swallowing.   Respiratory: Negative for cough, chest tightness, shortness of breath and wheezing.   Cardiovascular: Negative for chest pain, palpitations and leg swelling.  Gastrointestinal: Negative for nausea, abdominal pain, diarrhea, constipation and abdominal distention.  Musculoskeletal: Negative.   Skin: Negative.   Neurological: Negative.       Objective:   Physical Exam  Constitutional: She is oriented to person, place, and time. She appears well-developed and well-nourished.  HENT:  Head: Normocephalic and atraumatic.  Eyes: EOM are normal.  Neck: Normal range of motion.  Cardiovascular: Normal rate and regular rhythm.   Pulmonary/Chest: Effort normal and breath sounds normal. No respiratory distress. She has no wheezes. She has no rales.  Abdominal: Soft. Bowel sounds are normal. She exhibits no distension. There is no tenderness. There is no rebound.  Neurological: She is alert and oriented to person, place, and time. Coordination normal.  Skin: Skin is warm and dry.   Filed Vitals:   11/26/15 1037  BP: 100/64  Pulse: 68  Temp: 98.5 F (36.9 C)  TempSrc: Oral  Resp: 18  Height: 5\' 2"  (1.575 m)  Weight: 105 lb (47.628 kg)  SpO2: 99%      Assessment & Plan:

## 2016-08-25 LAB — HM PAP SMEAR

## 2017-07-19 ENCOUNTER — Encounter: Payer: Self-pay | Admitting: Family

## 2017-07-19 ENCOUNTER — Ambulatory Visit: Payer: BLUE CROSS/BLUE SHIELD | Admitting: Family

## 2017-07-19 VITALS — BP 102/80 | HR 70 | Temp 99.1°F | Ht 62.0 in | Wt 115.1 lb

## 2017-07-19 DIAGNOSIS — J069 Acute upper respiratory infection, unspecified: Secondary | ICD-10-CM | POA: Diagnosis not present

## 2017-07-19 DIAGNOSIS — B9789 Other viral agents as the cause of diseases classified elsewhere: Secondary | ICD-10-CM | POA: Diagnosis not present

## 2017-07-19 MED ORDER — BENZONATATE 100 MG PO CAPS: 100.0000 mg | ORAL_CAPSULE | Freq: Three times a day (TID) | ORAL | 0 refills | Status: DC | PRN

## 2017-07-19 MED ORDER — FLUTICASONE PROPIONATE 50 MCG/ACT NA SUSP: 2.0000 | Freq: Every day | NASAL | 6 refills | Status: DC

## 2017-07-19 NOTE — Progress Notes (Signed)
.    Amanda Farley is a 37 y.o. female with the following history as recorded in [employer]:  Patient Active Problem List   Diagnosis Date Noted  . Ovarian cyst 11/26/2015  . Left ear pain 04/22/2015    Current Outpatient Medications  Medication Sig Dispense Refill  . benzonatate (TESSALON) 100 MG capsule Take 1 capsule (100 mg total) by mouth 3 (three) times daily as needed for cough. 20 capsule 0  . fluticasone (FLONASE) 50 MCG/ACT nasal spray Place 2 sprays into both nostrils daily. 16 g 6   No current facility-administered medications for this visit.     Allergies: Patient has no known allergies.  Past Medical History:  Diagnosis Date  . Hearing loss     Past Surgical History:  Procedure Laterality Date  . INNER EAR SURGERY Left 2014  . LAPAROSCOPIC APPENDECTOMY N/A 05/08/2015   Procedure: APPENDECTOMY LAPAROSCOPIC;  Surgeon: Darnell Levelodd Gerkin, MD;  Location: [facility];  Service: General;  Laterality: N/A;  . Left TM      Family History  Family history unknown: Yes    Social History   Tobacco Use  . Smoking status: Never Smoker  . Smokeless tobacco: Never Used  Substance Use Topics  . Alcohol use: No    Subjective:  Sore throat, congestion x 2-3 days; throat "feels very tight and dry." + cough at night is problematic; denies chest pain or shortness of breath; has not taken any OTC medications;   Objective:  Vitals:   07/19/17 1353  BP: 102/80  Pulse: 70  Temp: 99.1 F (37.3 C)  TempSrc: Oral  SpO2: 99%  Weight: 115 lb 1.9 oz (52.2 kg)  Height: 5\' 2"  (1.575 m)    General: Well developed, well nourished, in no acute distress  Skin : Warm and dry.  Head: Normocephalic and atraumatic  Eyes: Sclera and conjunctiva clear; pupils round and reactive to light; extraocular movements intact  Ears: External normal; canals clear; tympanic membranes normal  Oropharynx: Pink, supple. No suspicious lesions  Neck: Supple without thyromegaly, adenopathy  Lungs: Respirations unlabored;  clear to auscultation bilaterally without wheeze, rales, rhonchi  CVS exam: normal rate and regular rhythm.  Neurologic: Alert and oriented; speech intact; face symmetrical; moves all extremities well; CNII-XII intact without focal deficit   Assessment:  1. Viral URI with cough     Plan:  Reassurance; symptomatic treatment discussed; Rx for Flonase and Tessalon Perles; increase fluids, rest and follow-up worse, no better.   No Follow-up on file.  No orders of the defined types were placed in this encounter.   Requested Prescriptions   Signed Prescriptions Disp Refills  . fluticasone (FLONASE) 50 MCG/ACT nasal spray 16 g 6    Sig: Place 2 sprays into both nostrils daily.  . benzonatate (TESSALON) 100 MG capsule 20 capsule 0    Sig: Take 1 capsule (100 mg total) by mouth 3 (three) times daily as needed for cough.

## 2017-07-19 NOTE — Patient Instructions (Signed)

## 2018-01-01 ENCOUNTER — Ambulatory Visit: Payer: BLUE CROSS/BLUE SHIELD | Admitting: Internal Medicine

## 2018-02-01 ENCOUNTER — Ambulatory Visit: Payer: BLUE CROSS/BLUE SHIELD | Admitting: Internal Medicine

## 2018-02-01 ENCOUNTER — Encounter: Payer: Self-pay | Admitting: Internal Medicine

## 2018-02-01 VITALS — BP 90/60 | HR 79 | Temp 98.8°F | Ht 62.0 in | Wt 115.0 lb

## 2018-02-01 DIAGNOSIS — J029 Acute pharyngitis, unspecified: Secondary | ICD-10-CM

## 2018-02-01 DIAGNOSIS — J069 Acute upper respiratory infection, unspecified: Secondary | ICD-10-CM

## 2018-02-01 NOTE — Progress Notes (Signed)
   Subjective:    Patient ID: Amanda Farley, female    DOB: 04/08/1981, 37 y.o.   MRN: [id number]  HPI The patient is a 37 YO female coming in for 2 days of aches, sinus pressure, headaches, nose drainage, sore throat. Overall it is worsening since onset. She has taken flonase for the last several days. This is not helping much. She denies fevers but some chills. Denies cough or SOB. Is not taking anything over the counter for any symptoms. Denies ear pain but some pressure.   Review of Systems  Constitutional: Positive for activity change, appetite change and chills. Negative for fatigue, fever and unexpected weight change.  HENT: Positive for congestion, postnasal drip, rhinorrhea, sinus pressure and sore throat. Negative for ear discharge, ear pain, sinus pain, sneezing, tinnitus, trouble swallowing and voice change.   Eyes: Negative.   Respiratory: Positive for cough. Negative for chest tightness, shortness of breath and wheezing.   Cardiovascular: Negative.   Gastrointestinal: Negative.   Musculoskeletal: Positive for myalgias.  Neurological: Positive for headaches.      Objective:   Physical Exam  Constitutional: She is oriented to person, place, and time. She appears well-developed and well-nourished.  HENT:  Head: Normocephalic and atraumatic.  Mouth/Throat: No tonsillar exudate.  Oropharynx with redness and clear drainage, nose with swollen turbinates, TMs normal bilaterally  Eyes: EOM are normal.  Neck: Normal range of motion. No thyromegaly present.  Cardiovascular: Normal rate and regular rhythm.  Pulmonary/Chest: Effort normal and breath sounds normal. No respiratory distress. She has no wheezes. She has no rales.  Abdominal: Soft.  Musculoskeletal: She exhibits tenderness.  Lymphadenopathy:    She has no cervical adenopathy.  Neurological: She is alert and oriented to person, place, and time.  Skin: Skin is warm and dry.   Vitals:   02/01/18 1546  BP: 90/60  Pulse:  79  Temp: 98.8 F (37.1 C)  TempSrc: Oral  SpO2: 99%  Weight: 115 lb (52.2 kg)  Height: 5\' 2"  (1.575 m)   Strep test: negative.    Assessment & Plan:

## 2018-02-01 NOTE — Patient Instructions (Signed)
Keep taking the flonase and start taking zyrtec (cetirizine). This is likely allergies or a viral cold. They normally last 7-10 days on average.   You can take ibuprofen or tylenol for aching.

## 2018-02-02 DIAGNOSIS — J069 Acute upper respiratory infection, unspecified: Secondary | ICD-10-CM | POA: Insufficient documentation

## 2018-02-02 LAB — POCT RAPID STREP A (OFFICE): RAPID STREP A SCREEN: NEGATIVE

## 2018-02-02 NOTE — Addendum Note (Signed)
Addended by: Berton LanGULCH, BRIANA R on: 02/02/2018 08:08 AM   Modules accepted: Orders

## 2018-02-02 NOTE — Assessment & Plan Note (Signed)
Continue flonase, add zyrtec. Can use tylenol for headaches. We talked about how this is likely viral. Given sore throat symptoms we did strep test which was negative. With her centor criteria this rules out bacterial strep.

## 2018-02-21 ENCOUNTER — Encounter: Payer: Self-pay | Admitting: Internal Medicine

## 2018-02-21 NOTE — Progress Notes (Signed)
Abstracted and sent to scan  

## 2019-06-11 ENCOUNTER — Ambulatory Visit: Payer: Medicaid Other | Attending: Internal Medicine

## 2019-06-11 ENCOUNTER — Ambulatory Visit: Payer: Self-pay | Attending: Internal Medicine

## 2019-06-11 DIAGNOSIS — Z20822 Contact with and (suspected) exposure to covid-19: Secondary | ICD-10-CM

## 2019-06-13 LAB — NOVEL CORONAVIRUS, NAA: SARS-CoV-2, NAA: NOT DETECTED

## 2019-08-15 ENCOUNTER — Other Ambulatory Visit: Payer: Self-pay | Admitting: Obstetrics and Gynecology

## 2019-08-15 DIAGNOSIS — R928 Other abnormal and inconclusive findings on diagnostic imaging of breast: Secondary | ICD-10-CM

## 2019-08-29 ENCOUNTER — Ambulatory Visit
Admission: RE | Admit: 2019-08-29 | Discharge: 2019-08-29 | Disposition: A | Discharge status: Home / Self Care | Payer: 59 | Source: Ambulatory Visit | Attending: Obstetrics and Gynecology | Admitting: Obstetrics and Gynecology

## 2019-08-29 ENCOUNTER — Other Ambulatory Visit: Payer: Self-pay

## 2019-08-29 ENCOUNTER — Ambulatory Visit
Admission: RE | Admit: 2019-08-29 | Discharge: 2019-08-29 | Disposition: A | Discharge status: Home / Self Care | Payer: Medicaid Other | Source: Ambulatory Visit | Attending: Obstetrics and Gynecology | Admitting: Obstetrics and Gynecology

## 2019-08-29 DIAGNOSIS — R928 Other abnormal and inconclusive findings on diagnostic imaging of breast: Secondary | ICD-10-CM

## 2020-09-28 IMAGING — MG MM DIGITAL DIAGNOSTIC UNILAT*L* IMPLANT W/ TOMO W/ CAD
6 series · 6 of 18 positions shown · non-contrast
Comparison: Previous exam(s).

CLINICAL DATA: Patient recalled from screening for left breast
asymmetry.

EXAM:
DIGITAL DIAGNOSTIC LEFT MAMMOGRAM WITH IMPLANTS AND TOMO
ULTRASOUND LEFT BREAST
The patient has retropectoral implants. Standard and implant
displaced views were performed.

[L ML synth-2D]
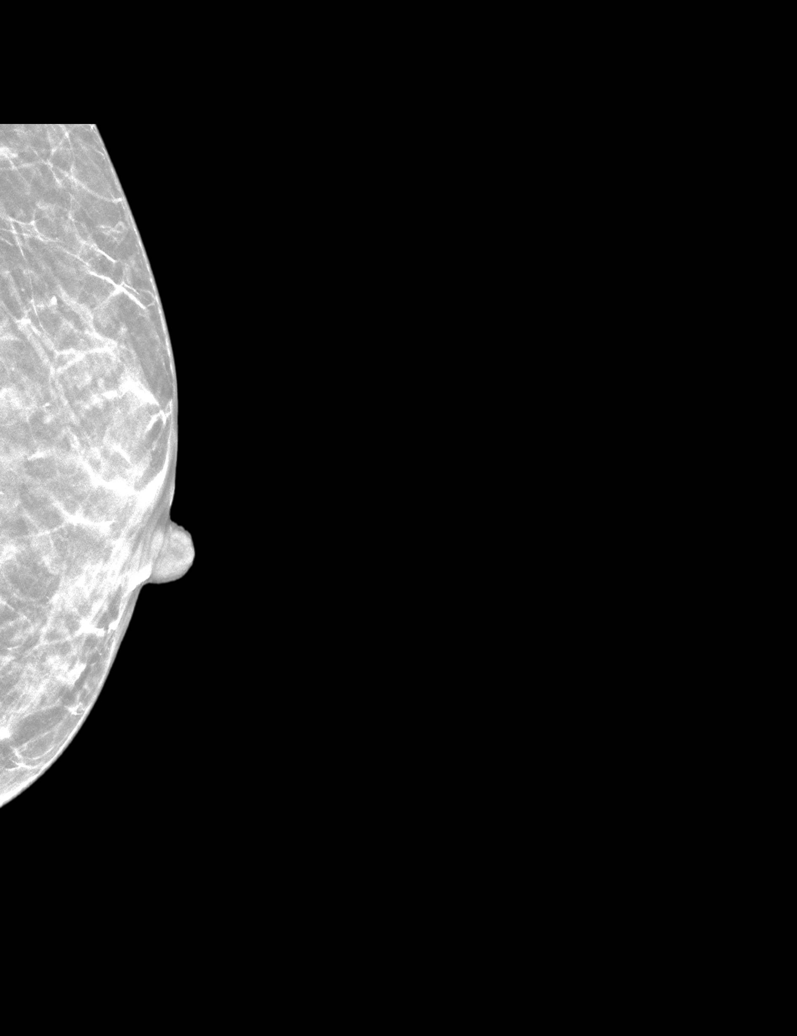

[L CC synth-2D]
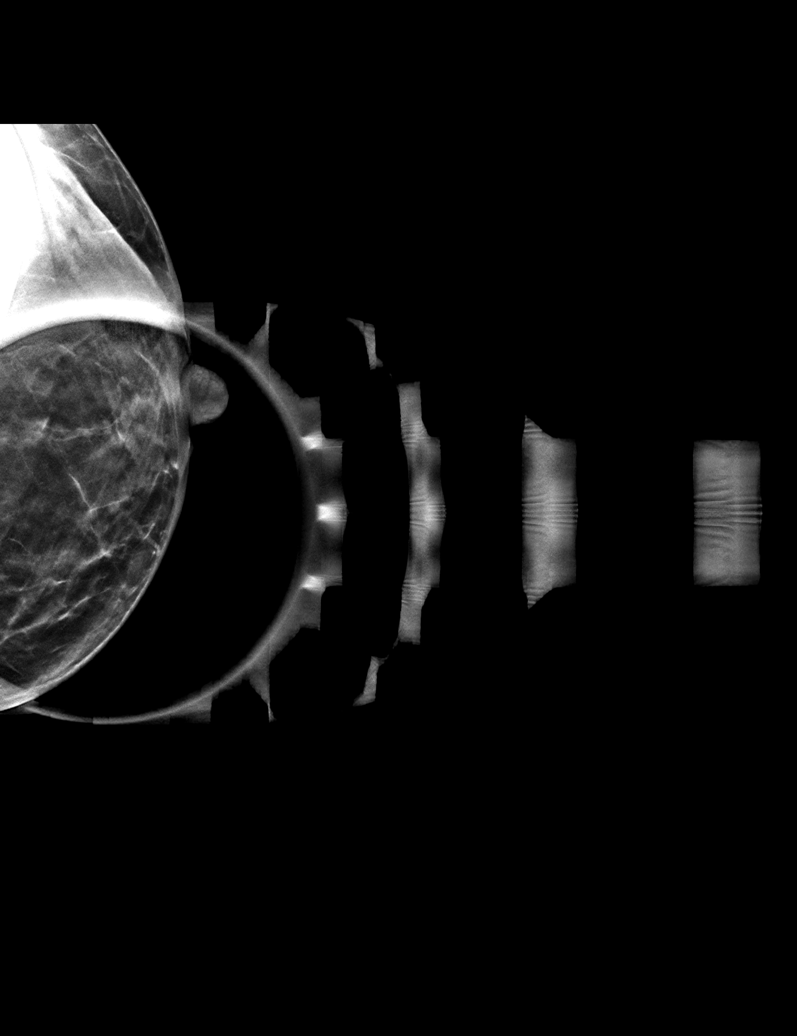

[L MLO synth-2D]
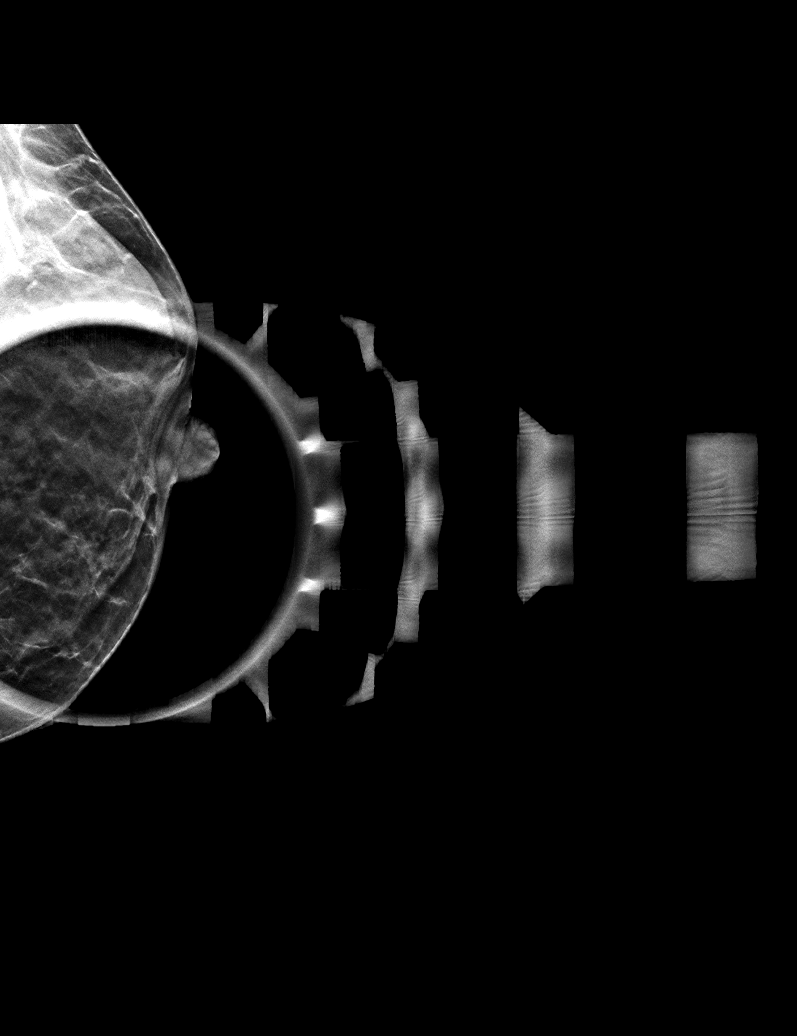

[L MLID BREAST TOMOSYNTHESIS IMAGE tomo · tomo slice 15/30.0]
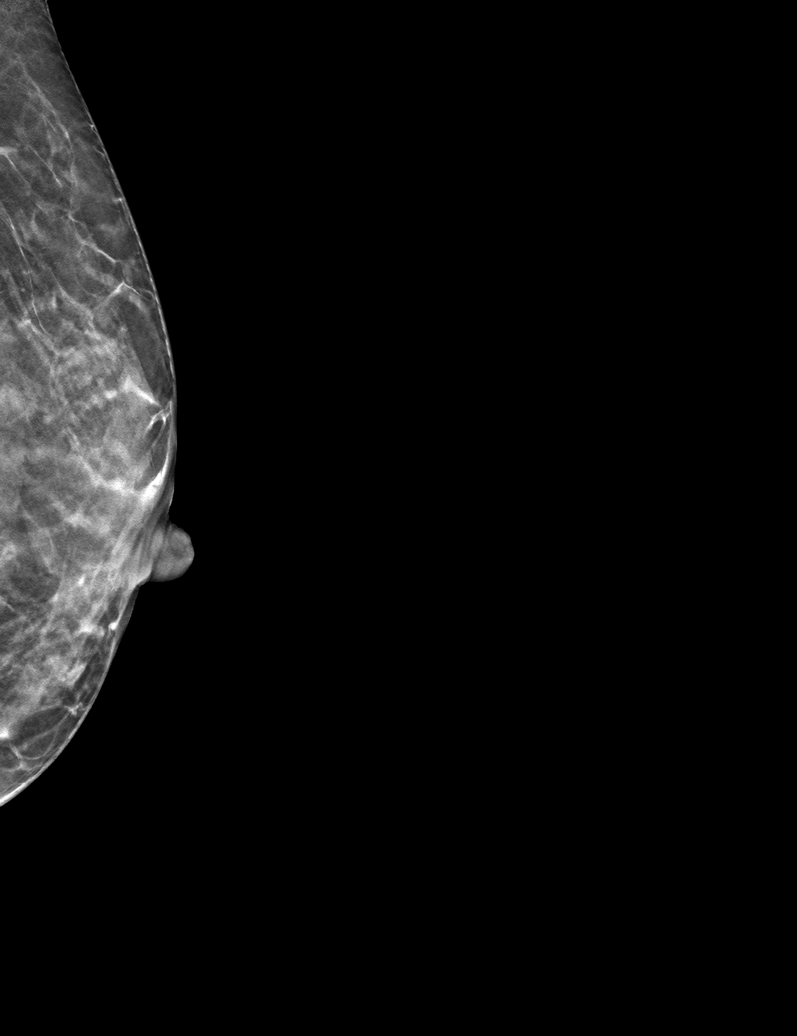

[L MLOID BREAST TOMOSYNTHESIS IMAGE tomo · tomo slice 13/25.0]
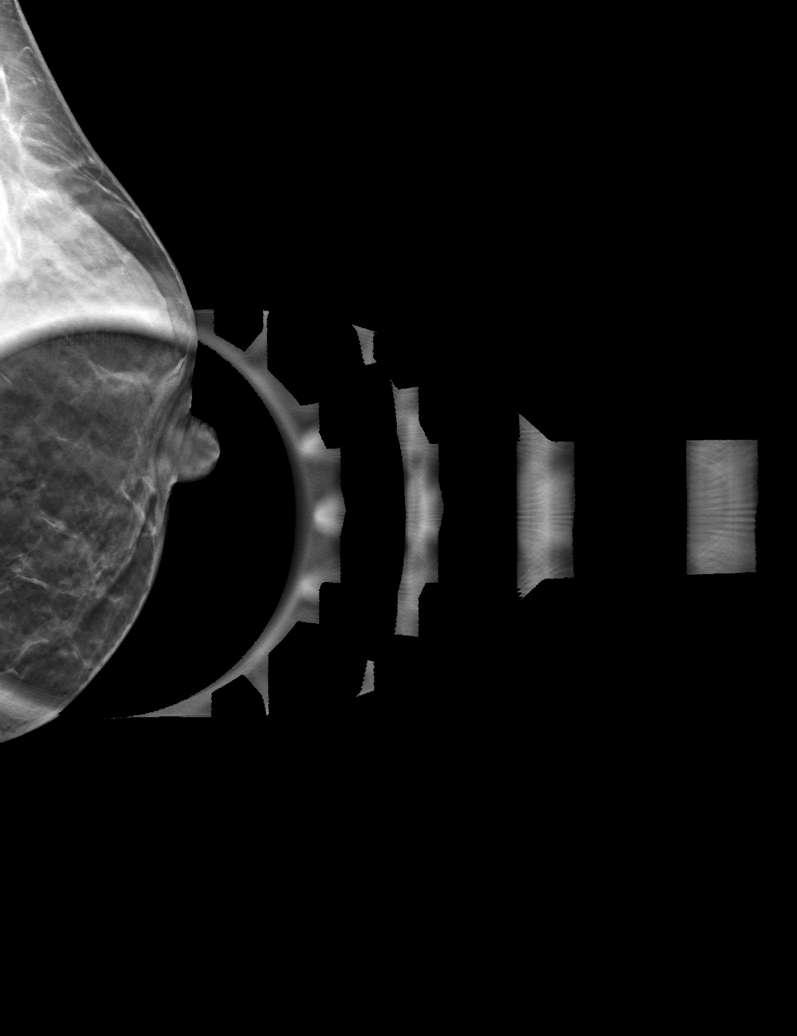

[L CCID BREAST TOMOSYNTHESIS IMAGE tomo · tomo slice 15/29.0]
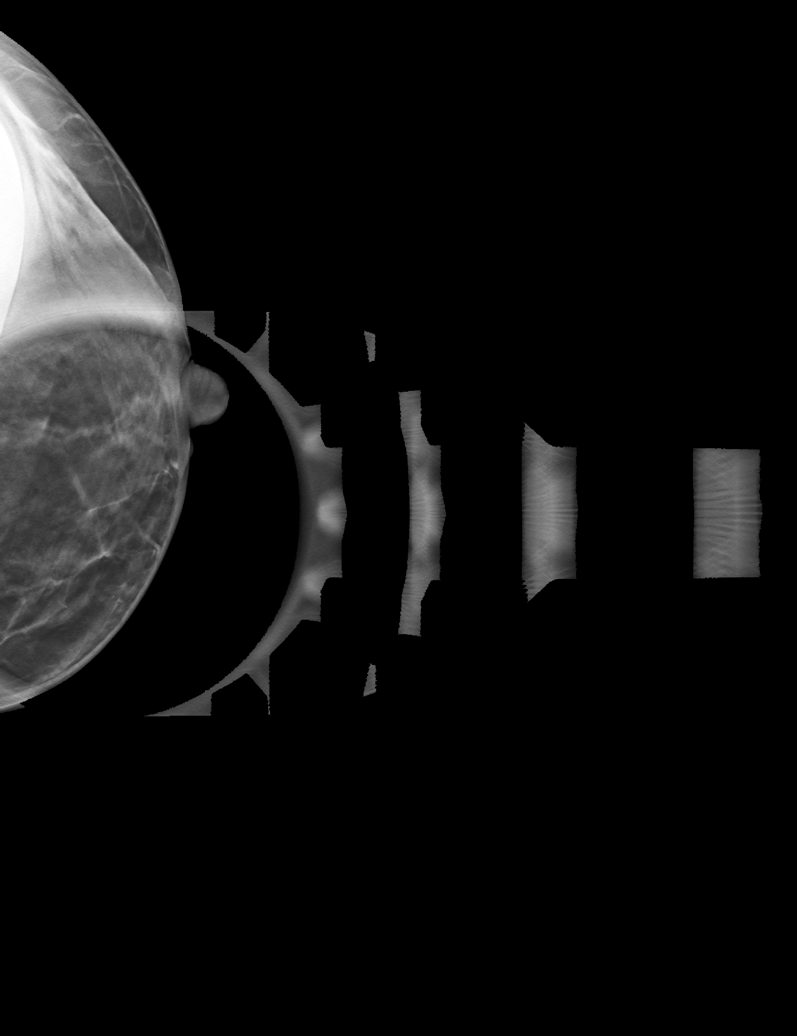

[6 of 18 positions shown; findings below may reference images not displayed]

ACR Breast Density Category c: The breast tissue is heterogeneously
dense, which may obscure small masses.
FINDINGS: Questioned asymmetry within the inferior aspect of the left breast
anteriorly appeared to resolve with additional imaging suggestive of
dense fibroglandular tissue.

On physical exam, no discrete mass is palpated within the
retroareolar left breast.

Targeted ultrasound is performed, showing normal tissue without
suspicious mass within the retroareolar and inferior left breast.
IMPRESSION: No mammographic evidence for malignancy.

RECOMMENDATION:
Screening mammogram in one year.(Code:LV-5-BTG)

I have discussed the findings and recommendations with the patient.
If applicable, a reminder letter will be sent to the patient
regarding the next appointment.

BI-RADS CATEGORY  2: Benign.

## 2020-11-03 ENCOUNTER — Encounter: Payer: Self-pay | Admitting: Internal Medicine

## 2020-11-03 ENCOUNTER — Telehealth (INDEPENDENT_AMBULATORY_CARE_PROVIDER_SITE_OTHER): Payer: 59 | Admitting: Internal Medicine

## 2020-11-03 DIAGNOSIS — U071 COVID-19: Secondary | ICD-10-CM | POA: Diagnosis not present

## 2020-11-03 MED ORDER — PROMETHAZINE-DM 6.25-15 MG/5ML PO SYRP: 5.0000 mL | ORAL_SOLUTION | Freq: Four times a day (QID) | ORAL | 0 refills | Status: DC | PRN

## 2020-11-03 MED ORDER — SUMATRIPTAN SUCCINATE 50 MG PO TABS: 50.0000 mg | ORAL_TABLET | ORAL | 0 refills | Status: DC | PRN

## 2020-11-03 NOTE — Progress Notes (Signed)
Virtual Visit via Audio Note  I connected with Griselda Miner on 11/03/20 at  2:20 PM EDT by an audio-only enabled telemedicine application and verified that I am speaking with the correct person using two identifiers.  The patient and the provider were at separate locations throughout the entire encounter. Patient location: home, Provider location: work   I discussed the limitations of evaluation and management by telemedicine and the availability of in person appointments. The patient expressed understanding and agreed to proceed. The patient and the provider were the only parties present for the visit unless noted in HPI below.  History of Present Illness: The patient is a 40 y.o. female with visit for cough and headache and sore throat. Started Saturday. Has not been able to sleep. Denies fevers or chills. Cough is non-productive. Overall it is not improving. Boyfriend is sick and has covid-19. Has tried tylenol. Has had 2 shots for covid-19.   Observations/Objective: A and O times 3, no coughing during visit, no dyspnea, speaking in full sentences  Assessment and Plan: See problem oriented charting  Follow Up Instructions: rx promethazine/dm and imitrex  Visit time 11 minutes in face to face communication with patient and coordination of care.  I discussed the assessment and treatment plan with the patient. The patient was provided an opportunity to ask questions and all were answered. The patient agreed with the plan and demonstrated an understanding of the instructions.   The patient was advised to call back or seek an in-person evaluation if the symptoms worsen or if the condition fails to improve as anticipated.  Myrlene Broker, MD

## 2020-11-03 NOTE — Assessment & Plan Note (Signed)
Rx promethazine/dm cough syrup and imitrex for headache. Has had 2 vaccines so antiviral is not indicated.

## 2021-10-16 ENCOUNTER — Encounter: Payer: Self-pay | Admitting: Emergency Medicine

## 2021-10-16 ENCOUNTER — Ambulatory Visit
Admission: EM | Admit: 2021-10-16 | Discharge: 2021-10-16 | Disposition: A | Discharge status: Home / Self Care | Payer: 59 | Attending: Emergency Medicine | Admitting: Emergency Medicine

## 2021-10-16 DIAGNOSIS — K047 Periapical abscess without sinus: Secondary | ICD-10-CM | POA: Diagnosis not present

## 2021-10-16 MED ORDER — PENICILLIN V POTASSIUM 500 MG PO TABS: 500.0000 mg | ORAL_TABLET | Freq: Three times a day (TID) | ORAL | 0 refills | Status: AC

## 2021-10-16 MED ORDER — IBUPROFEN 800 MG PO TABS: 800.0000 mg | ORAL_TABLET | Freq: Three times a day (TID) | ORAL | 0 refills | Status: DC | PRN

## 2021-10-16 NOTE — ED Provider Notes (Signed)
?[facility] ? ? ? ?CSN: [id number] ?Arrival date & time: 10/16/21  1457 ?  ? ?HISTORY  ? ?Chief Complaint  ?Patient presents with  ? Dental Pain  ? ?HPI ?Amanda Farley is a 41 y.o. female. Patient presents urgent care complaining of pain in one of her left bottom molars that she states she has had filled twice 2 different dentists.  Patient states that the tooth is still not right and continues to have worsening pain.  Patient endorses some swelling of the left side of her face denies fever, headache or pain radiating to her ear or neck. ? ?The history is provided by the patient. No language interpreter was used.  ?Past Medical History:  ?Diagnosis Date  ? Hearing loss   ? ?Patient Active Problem List  ? Diagnosis Date Noted  ? COVID-19 11/03/2020  ? Ovarian cyst 11/26/2015  ? ?Past Surgical History:  ?Procedure Laterality Date  ? INNER EAR SURGERY Left 2014  ? LAPAROSCOPIC APPENDECTOMY N/A 05/08/2015  ? Procedure: APPENDECTOMY LAPAROSCOPIC;  Surgeon: Armandina Gemma, MD;  Location: [facility];  Service: General;  Laterality: N/A;  ? Left TM    ? ?OB History   ? ? Gravida  ?2  ? Para  ?2  ? Term  ?1  ? Preterm  ?1  ? AB  ?0  ? Living  ?2  ?  ? ? SAB  ?0  ? IAB  ?0  ? Ectopic  ?0  ? Multiple  ?0  ? Live Births  ?2  ?   ?  ?  ? ?Home Medications   ? ?Prior to Admission medications   ?Not on File  ? ? ?Family History ?Family History  ?Family history unknown: Yes  ? ?Social History ?Social History  ? ?Tobacco Use  ? Smoking status: Never  ? Smokeless tobacco: Never  ?Substance Use Topics  ? Alcohol use: No  ? Drug use: No  ? ?Allergies   ?Patient has no known allergies. ? ?Review of Systems ?Review of Systems ?Pertinent findings noted in history of present illness.  ? ?Physical Exam ?Triage Vital Signs ?ED Triage Vitals  ?Enc Vitals Group  ?   BP 04/02/21 0827 (!) 147/82  ?   Pulse Rate 04/02/21 0827 72  ?   Resp 04/02/21 0827 18  ?   Temp 04/02/21 0827 98.3 ?F (36.8 ?C)  ?   Temp Source 04/02/21 0827 Oral  ?   SpO2  04/02/21 0827 98 %  ?   Weight --   ?   Height --   ?   Head Circumference --   ?   Peak Flow --   ?   Pain Score 04/02/21 0826 5  ?   Pain Loc --   ?   Pain Edu? --   ?   Excl. in [place]? --   ?No data found. ? ?Updated Vital Signs ?BP 117/76 (BP Location: Left Arm)   Pulse 64   Temp 98.3 ?F (36.8 ?C) (Oral)   Resp 16   SpO2 99%  ? ?Physical Exam ?Vitals and nursing note reviewed.  ?Constitutional:   ?   General: She is not in acute distress. ?   Appearance: Normal appearance. She is not ill-appearing.  ?HENT:  ?   Head: Normocephalic and atraumatic.  ?   Jaw: There is normal jaw occlusion. No tenderness, swelling, pain on movement or malocclusion.  ?   Salivary Glands: Right salivary gland is not diffusely enlarged or  tender. Left salivary gland is not diffusely enlarged or tender.  ?   Comments: Mild swelling of left lower jaw. ?   Right Ear: Hearing, tympanic membrane, ear canal and external ear normal. No drainage. No middle ear effusion. There is no impacted cerumen. Tympanic membrane is not erythematous or bulging.  ?   Left Ear: Hearing, tympanic membrane, ear canal and external ear normal. No drainage.  No middle ear effusion. There is no impacted cerumen. Tympanic membrane is not erythematous or bulging.  ?   Nose: Nose normal. No nasal deformity, septal deviation, mucosal edema, congestion or rhinorrhea.  ?   Right Turbinates: Not enlarged, swollen or pale.  ?   Left Turbinates: Not enlarged, swollen or pale.  ?   Right Sinus: No maxillary sinus tenderness or frontal sinus tenderness.  ?   Left Sinus: No maxillary sinus tenderness or frontal sinus tenderness.  ?   Mouth/Throat:  ?   Lips: Pink. No lesions.  ?   Mouth: Mucous membranes are moist. No injury or oral lesions.  ?   Dentition: Abnormal dentition. Dental tenderness present.  ?   Pharynx: Oropharynx is clear. Uvula midline. No pharyngeal swelling, posterior oropharyngeal erythema or uvula swelling.  ?   Tonsils: No tonsillar exudate or tonsillar  abscesses. 0 on the right. 0 on the left.  ? ?Eyes:  ?   General: Lids are normal.     ?   Right eye: No discharge.     ?   Left eye: No discharge.  ?   Extraocular Movements: Extraocular movements intact.  ?   Conjunctiva/sclera: Conjunctivae normal.  ?   Right eye: Right conjunctiva is not injected.  ?   Left eye: Left conjunctiva is not injected.  ?Neck:  ?   Trachea: Trachea and phonation normal.  ?Cardiovascular:  ?   Rate and Rhythm: Normal rate and regular rhythm.  ?   Pulses: Normal pulses.  ?   Heart sounds: Normal heart sounds. No murmur heard. ?  No friction rub. No gallop.  ?Pulmonary:  ?   Effort: Pulmonary effort is normal. No accessory muscle usage, prolonged expiration or respiratory distress.  ?   Breath sounds: Normal breath sounds. No stridor, decreased air movement or transmitted upper airway sounds. No decreased breath sounds, wheezing, rhonchi or rales.  ?Chest:  ?   Chest wall: No tenderness.  ?Musculoskeletal:     ?   General: Normal range of motion.  ?   Cervical back: Normal range of motion and neck supple. Normal range of motion.  ?Lymphadenopathy:  ?   Cervical: No cervical adenopathy.  ?Skin: ?   General: Skin is warm and dry.  ?   Findings: No erythema or rash.  ?Neurological:  ?   General: No focal deficit present.  ?   Mental Status: She is alert and oriented to person, place, and time.  ?Psychiatric:     ?   Mood and Affect: Mood normal.     ?   Behavior: Behavior normal.  ? ? ?Visual Acuity ?Right Eye Distance:   ?Left Eye Distance:   ?Bilateral Distance:   ? ?Right Eye Near:   ?Left Eye Near:    ?Bilateral Near:    ? ?UC Couse / Diagnostics / Procedures:  ?  ?EKG ? ?Radiology ?No results found. ? ?Procedures ?Procedures (including critical care time) ? ?UC Diagnoses / Final Clinical Impressions(s)   ?I have reviewed the triage vital signs and the nursing notes. ? ?  Pertinent labs & imaging results that were available during my care of the patient were reviewed by me and considered  in my medical decision making (see chart for details).   ? ?Final diagnoses:  ?Dental infection  ? ?Patient advised that I recommend she begin penicillin at this time for presumed dental infection.  I advised her to follow-up with the same dentist as each dentist has a different style and continuing to see different dentist could further complicate this issue.  Patient provided with ibuprofen for pain. ? ?ED Prescriptions   ? ? Medication Sig Dispense Auth. Provider  ? penicillin v potassium (VEETID) 500 MG tablet Take 1 tablet (500 mg total) by mouth 3 (three) times daily for 14 days. 42 tablet Lynden Oxford Scales, PA-C  ? ibuprofen (ADVIL) 800 MG tablet Take 1 tablet (800 mg total) by mouth every 8 (eight) hours as needed for up to 21 doses for fever, headache, mild pain or moderate pain. 21 tablet Lynden Oxford Scales, PA-C  ? ?  ? ?PDMP not reviewed this encounter. ? ?Pending results:  ?Labs Reviewed - No data to display ? ?Medications Ordered in UC: ?Medications - No data to display ? ?Disposition Upon Discharge:  ?Condition: stable for discharge home ?Home: take medications as prescribed; routine discharge instructions as discussed; follow up as advised. ? ?Patient presented with an acute illness with associated systemic symptoms and significant discomfort requiring urgent management. In my opinion, this is a condition that a prudent lay person (someone who possesses an average knowledge of health and medicine) may potentially expect to result in complications if not addressed urgently such as respiratory distress, impairment of bodily function or dysfunction of bodily organs.  ? ?Routine symptom specific, illness specific and/or disease specific instructions were discussed with the patient and/or caregiver at length.  ? ?As such, the patient has been evaluated and assessed, work-up was performed and treatment was provided in alignment with urgent care protocols and evidence based medicine.   Patient/parent/caregiver has been advised that the patient may require follow up for further testing and treatment if the symptoms continue in spite of treatment, as clinically indicated and appropriate. ? ?Patient/par

## 2021-10-16 NOTE — Discharge Instructions (Signed)
Please begin penicillin 1 tablet 3 times daily for the next 14 days.  Please be sure you finish the entire prescription to ensure resolution of the infection in your tooth.  Failure to take all medication can result in worsening infection or spread of the infection to other locations. ? ?I also provided you with a prescription for ibuprofen that you can take every 8 hours for pain. ? ?Please follow-up with your dentist.  Thank you for visiting urgent care. ?

## 2021-10-16 NOTE — ED Triage Notes (Signed)
Dental filling problem, started Tuesday, pain since ?

## 2021-12-01 DIAGNOSIS — Z01419 Encounter for gynecological examination (general) (routine) without abnormal findings: Secondary | ICD-10-CM | POA: Diagnosis not present

## 2021-12-01 DIAGNOSIS — R69 Illness, unspecified: Secondary | ICD-10-CM | POA: Diagnosis not present

## 2021-12-01 DIAGNOSIS — Z124 Encounter for screening for malignant neoplasm of cervix: Secondary | ICD-10-CM | POA: Diagnosis not present

## 2021-12-01 DIAGNOSIS — Z6821 Body mass index (BMI) 21.0-21.9, adult: Secondary | ICD-10-CM | POA: Diagnosis not present

## 2021-12-01 DIAGNOSIS — Z1231 Encounter for screening mammogram for malignant neoplasm of breast: Secondary | ICD-10-CM | POA: Diagnosis not present

## 2021-12-03 LAB — HM PAP SMEAR

## 2021-12-14 ENCOUNTER — Encounter: Payer: Self-pay | Admitting: Emergency Medicine

## 2021-12-14 ENCOUNTER — Ambulatory Visit
Admission: EM | Admit: 2021-12-14 | Discharge: 2021-12-14 | Disposition: A | Discharge status: Home / Self Care | Payer: 59 | Attending: Internal Medicine | Admitting: Internal Medicine

## 2021-12-14 ENCOUNTER — Other Ambulatory Visit: Payer: Self-pay

## 2021-12-14 DIAGNOSIS — R051 Acute cough: Secondary | ICD-10-CM

## 2021-12-14 DIAGNOSIS — B349 Viral infection, unspecified: Secondary | ICD-10-CM | POA: Diagnosis not present

## 2021-12-14 MED ORDER — PROMETHAZINE-DM 6.25-15 MG/5ML PO SYRP: 5.0000 mL | ORAL_SOLUTION | Freq: Every day | ORAL | 0 refills | Status: DC

## 2021-12-14 MED ORDER — BENZONATATE 100 MG PO CAPS: 100.0000 mg | ORAL_CAPSULE | Freq: Three times a day (TID) | ORAL | 0 refills | Status: DC | PRN

## 2021-12-14 NOTE — ED Triage Notes (Signed)
Pt sts cough x 1 week worse at night

## 2021-12-14 NOTE — Discharge Instructions (Signed)
It appears that you have a viral illness that is causing your cough.  You have been prescribed 2 medications to alleviate symptoms.  Promethazine DM is to be taken at night as it may cause drowsiness.  Please follow-up if symptoms persist or worsen.

## 2021-12-14 NOTE — ED Provider Notes (Signed)
[name]    CSN: [id number] Arrival date & time: 12/14/21  1206      History   Chief Complaint Chief Complaint  Patient presents with   Cough    HPI Amanda Farley is a 41 y.o. female.   Patient presents with cough that has been present for approximately 1 week.  It is a harsh, dry cough.  Reports throat irritation that occurs only when coughing.  Denies any associated nasal congestion, runny nose, ear pain, nausea, vomiting, diarrhea, abdominal pain, chest pain, shortness of breath.  Denies history of asthma or COPD and patient is not a smoker.  Patient has taken Mucinex and honey tea with minimal improvement.  Denies any known sick contacts.   Cough   Past Medical History:  Diagnosis Date   Hearing loss     Patient Active Problem List   Diagnosis Date Noted   COVID-19 11/03/2020   Ovarian cyst 11/26/2015    Past Surgical History:  Procedure Laterality Date   INNER EAR SURGERY Left 2014   LAPAROSCOPIC APPENDECTOMY N/A 05/08/2015   Procedure: APPENDECTOMY LAPAROSCOPIC;  Surgeon: Darnell Level, MD;  Location: [facility];  Service: General;  Laterality: N/A;   Left TM      OB History     Gravida  2   Para  2   Term  1   Preterm  1   AB  0   Living  2      SAB  0   IAB  0   Ectopic  0   Multiple  0   Live Births  2            Home Medications    Prior to Admission medications   Medication Sig Start Date End Date Taking? Authorizing Provider  benzonatate (TESSALON) 100 MG capsule Take 1 capsule (100 mg total) by mouth every 8 (eight) hours as needed for cough. 12/14/21  Yes Reagyn Facemire, Rolly Salter E, FNP  promethazine-dextromethorphan (PROMETHAZINE-DM) 6.25-15 MG/5ML syrup Take 5 mLs by mouth at bedtime. 12/14/21  Yes Gaelen Brager, Rolly Salter E, FNP  ibuprofen (ADVIL) 800 MG tablet Take 1 tablet (800 mg total) by mouth every 8 (eight) hours as needed for up to 21 doses for fever, headache, mild pain or moderate pain. 10/16/21   Theadora Rama Scales, PA-C     Family History Family History  Family history unknown: Yes    Social History Social History   Tobacco Use   Smoking status: Never   Smokeless tobacco: Never  Substance Use Topics   Alcohol use: No   Drug use: No     Allergies   Patient has no known allergies.   Review of Systems Review of Systems Per HPI  Physical Exam Triage Vital Signs ED Triage Vitals  Enc Vitals Group     BP 12/14/21 1227 116/73     Pulse Rate 12/14/21 1227 74     Resp 12/14/21 1227 18     Temp 12/14/21 1227 99.2 F (37.3 C)     Temp Source 12/14/21 1227 Oral     SpO2 12/14/21 1227 98 %     Weight --      Height --      Head Circumference --      Peak Flow --      Pain Score 12/14/21 1228 0     Pain Loc --      Pain Edu? --      Excl. in [place]? --  No data found.  Updated Vital Signs BP 116/73 (BP Location: Left Arm)   Pulse 74   Temp 99.2 F (37.3 C) (Oral)   Resp 18   SpO2 98%   Visual Acuity Right Eye Distance:   Left Eye Distance:   Bilateral Distance:    Right Eye Near:   Left Eye Near:    Bilateral Near:     Physical Exam Constitutional:      General: She is not in acute distress.    Appearance: Normal appearance. She is not toxic-appearing or diaphoretic.  HENT:     Head: Normocephalic and atraumatic.     Right Ear: Tympanic membrane and ear canal normal.     Left Ear: Tympanic membrane and ear canal normal.     Nose: Congestion present.     Mouth/Throat:     Mouth: Mucous membranes are moist.     Pharynx: No posterior oropharyngeal erythema.  Eyes:     Extraocular Movements: Extraocular movements intact.     Conjunctiva/sclera: Conjunctivae normal.     Pupils: Pupils are equal, round, and reactive to light.  Cardiovascular:     Rate and Rhythm: Normal rate and regular rhythm.     Pulses: Normal pulses.     Heart sounds: Normal heart sounds.  Pulmonary:     Effort: Pulmonary effort is normal. No respiratory distress.     Breath sounds: Normal breath  sounds. No stridor. No wheezing, rhonchi or rales.  Abdominal:     General: Abdomen is flat. Bowel sounds are normal.     Palpations: Abdomen is soft.  Musculoskeletal:        General: Normal range of motion.     Cervical back: Normal range of motion.  Skin:    General: Skin is warm and dry.  Neurological:     General: No focal deficit present.     Mental Status: She is alert and oriented to person, place, and time. Mental status is at baseline.  Psychiatric:        Mood and Affect: Mood normal.        Behavior: Behavior normal.      UC Treatments / Results  Labs (all labs ordered are listed, but only abnormal results are displayed) Labs Reviewed - No data to display  EKG   Radiology No results found.  Procedures Procedures (including critical care time)  Medications Ordered in UC Medications - No data to display  Initial Impression / Assessment and Plan / UC Course  I have reviewed the triage vital signs and the nursing notes.  Pertinent labs & imaging results that were available during my care of the patient were reviewed by me and considered in my medical decision making (see chart for details).     Patient presents with symptoms likely from a viral upper respiratory infection. Differential includes bacterial pneumonia, sinusitis, allergic rhinitis, COVID-19, flu. Do not suspect underlying cardiopulmonary process. Symptoms seem unlikely related to ACS, CHF or COPD exacerbations, pneumonia, pneumothorax. Patient is nontoxic appearing and not in need of emergent medical intervention.  Do not think that viral testing is necessary given duration of symptoms and would not change treatment.  Recommended symptom control with medications.  Promethazine DM prescribed for patient to take at night and advised patient that it can cause drowsiness.  Benzonatate prescribed for patient to take during the day.  Return if symptoms fail to improve in 1-2 weeks or you develop shortness  of breath, chest pain, severe headache. Patient  states understanding and is agreeable.  Discharged with PCP followup.  Final Clinical Impressions(s) / UC Diagnoses   Final diagnoses:  Viral illness  Acute cough     Discharge Instructions      It appears that you have a viral illness that is causing your cough.  You have been prescribed 2 medications to alleviate symptoms.  Promethazine DM is to be taken at night as it may cause drowsiness.  Please follow-up if symptoms persist or worsen.    ED Prescriptions     Medication Sig Dispense Auth. Provider   promethazine-dextromethorphan (PROMETHAZINE-DM) 6.25-15 MG/5ML syrup Take 5 mLs by mouth at bedtime. 118 mL Chenell Lozon, [place] E, [place]   benzonatate (TESSALON) 100 MG capsule Take 1 capsule (100 mg total) by mouth every 8 (eight) hours as needed for cough. 21 capsule [place], Acie Fredrickson, [place]      PDMP not reviewed this encounter.   Gustavus Bryant, [place] 12/14/21 1239

## 2022-05-06 DIAGNOSIS — Z419 Encounter for procedure for purposes other than remedying health state, unspecified: Secondary | ICD-10-CM | POA: Diagnosis not present

## 2022-05-11 ENCOUNTER — Ambulatory Visit
Admission: EM | Admit: 2022-05-11 | Discharge: 2022-05-11 | Disposition: A | Discharge status: Home / Self Care | Payer: 59 | Attending: Internal Medicine | Admitting: Internal Medicine

## 2022-05-11 ENCOUNTER — Encounter: Payer: Self-pay | Admitting: Emergency Medicine

## 2022-05-11 DIAGNOSIS — J208 Acute bronchitis due to other specified organisms: Secondary | ICD-10-CM

## 2022-05-11 MED ORDER — ALBUTEROL SULFATE HFA 108 (90 BASE) MCG/ACT IN AERS: 1.0000 | INHALATION_SPRAY | Freq: Four times a day (QID) | RESPIRATORY_TRACT | 0 refills | Status: DC | PRN

## 2022-05-11 MED ORDER — BENZONATATE 100 MG PO CAPS: 100.0000 mg | ORAL_CAPSULE | Freq: Three times a day (TID) | ORAL | 0 refills | Status: DC | PRN

## 2022-05-11 MED ORDER — PREDNISONE 20 MG PO TABS: 40.0000 mg | ORAL_TABLET | Freq: Every day | ORAL | 0 refills | Status: AC

## 2022-05-11 NOTE — ED Triage Notes (Signed)
Pt is present today with cough and congestion for ten days

## 2022-05-11 NOTE — ED Provider Notes (Signed)
Amanda Farley    CSN: [id number] Arrival date & time: 05/11/22  1123      History   Chief Complaint Chief Complaint  Patient presents with   Cough    HPI Amanda Farley is a 41 y.o. female.   Patient presents with persistent cough that has been present for about 10 days.  Patient reports that she had nasal congestion at start of symptoms that has now resolved.  Cough was dry at first but is now slightly productive.  She denies any associated fever, chest pain, shortness of breath.  She has tried Delsym over-the-counter with minimal improvement.  Patient denies history of asthma and does not smoke cigarettes.   Cough   Past Medical History:  Diagnosis Date   Hearing loss     Patient Active Problem List   Diagnosis Date Noted   COVID-19 11/03/2020   Ovarian cyst 11/26/2015    Past Surgical History:  Procedure Laterality Date   INNER EAR SURGERY Left 2014   LAPAROSCOPIC APPENDECTOMY N/A 05/08/2015   Procedure: APPENDECTOMY LAPAROSCOPIC;  Surgeon: Darnell Level, MD;  Location: [facility];  Service: General;  Laterality: N/A;   Left TM      OB History     Gravida  2   Para  2   Term  1   Preterm  1   AB  0   Living  2      SAB  0   IAB  0   Ectopic  0   Multiple  0   Live Births  2            Home Medications    Prior to Admission medications   Medication Sig Start Date End Date Taking? Authorizing Provider  albuterol (VENTOLIN HFA) 108 (90 Base) MCG/ACT inhaler Inhale 1-2 puffs into the lungs every 6 (six) hours as needed for wheezing or shortness of breath. 05/11/22  Yes Trinidad Petron, Rolly Salter E, FNP  benzonatate (TESSALON) 100 MG capsule Take 1 capsule (100 mg total) by mouth every 8 (eight) hours as needed for cough. 05/11/22  Yes Olanda Boughner, Rolly Salter E, FNP  predniSONE (DELTASONE) 20 MG tablet Take 2 tablets (40 mg total) by mouth daily for 5 days. 05/11/22 05/16/22 Yes Adron Geisel, Acie Fredrickson, FNP  ibuprofen (ADVIL) 800 MG tablet Take 1 tablet (800 mg total)  by mouth every 8 (eight) hours as needed for up to 21 doses for fever, headache, mild pain or moderate pain. 10/16/21   Theadora Rama Scales, PA-C    Family History Family History  Family history unknown: Yes    Social History Social History   Tobacco Use   Smoking status: Never   Smokeless tobacco: Never  Substance Use Topics   Alcohol use: No   Drug use: No     Allergies   Patient has no known allergies.   Review of Systems Review of Systems Per HPI  Physical Exam Triage Vital Signs ED Triage Vitals  Enc Vitals Group     BP 05/11/22 1256 117/77     Pulse Rate 05/11/22 1256 62     Resp 05/11/22 1256 18     Temp 05/11/22 1257 98.2 F (36.8 C)     Temp src --      SpO2 05/11/22 1256 99 %     Weight --      Height --      Head Circumference --      Peak Flow --      Pain  Score 05/11/22 1255 0     Pain Loc --      Pain Edu? --      Excl. in [place]? --    No data found.  Updated Vital Signs BP 117/77   Pulse 62   Temp 98.2 F (36.8 C)   Resp 18   SpO2 99%   Visual Acuity Right Eye Distance:   Left Eye Distance:   Bilateral Distance:    Right Eye Near:   Left Eye Near:    Bilateral Near:     Physical Exam Constitutional:      General: She is not in acute distress.    Appearance: Normal appearance. She is not toxic-appearing or diaphoretic.  HENT:     Head: Normocephalic and atraumatic.     Right Ear: Tympanic membrane and ear canal normal.     Left Ear: Tympanic membrane and ear canal normal.     Nose: No congestion.     Mouth/Throat:     Mouth: Mucous membranes are moist.     Pharynx: No posterior oropharyngeal erythema.  Eyes:     Extraocular Movements: Extraocular movements intact.     Conjunctiva/sclera: Conjunctivae normal.     Pupils: Pupils are equal, round, and reactive to light.  Cardiovascular:     Rate and Rhythm: Normal rate and regular rhythm.     Pulses: Normal pulses.     Heart sounds: Normal heart sounds.  Pulmonary:      Effort: Pulmonary effort is normal. No respiratory distress.     Breath sounds: No stridor. Wheezing present. No rhonchi or rales.     Comments: Mild wheezing bilaterally. Abdominal:     General: Abdomen is flat. Bowel sounds are normal.     Palpations: Abdomen is soft.  Musculoskeletal:        General: Normal range of motion.     Cervical back: Normal range of motion.  Skin:    General: Skin is warm and dry.  Neurological:     General: No focal deficit present.     Mental Status: She is alert and oriented to person, place, and time. Mental status is at baseline.  Psychiatric:        Mood and Affect: Mood normal.        Behavior: Behavior normal.      UC Treatments / Results  Labs (all labs ordered are listed, but only abnormal results are displayed) Labs Reviewed - No data to display  EKG   Radiology No results found.  Procedures Procedures (including critical Farley time)  Medications Ordered in UC Medications - No data to display  Initial Impression / Assessment and Plan / UC Course  I have reviewed the triage vital signs and the nursing notes.  Pertinent labs & imaging results that were available during my Farley of the patient were reviewed by me and considered in my medical decision making (see chart for details).     Patient's symptoms and physical exam appear consistent with acute viral bronchitis.  Patient has minimal wheezing on exam but no other adventitious lung sounds so do not think that chest imaging is necessary.  Vital signs and oxygen is also stable.  Will treat with prednisone, benzonatate, albuterol inhaler.  No obvious contraindications to prednisone noted in patient's history.  Do not think that viral testing is necessary given duration of symptoms as it would not change treatment.  Patient was advised to follow-up if symptoms persist or worsen.  Patient verbalized understanding  and was agreeable with plan. Final Clinical Impressions(s) / UC Diagnoses    Final diagnoses:  Acute viral bronchitis     Discharge Instructions      It appears that you have viral bronchitis. I have prescribed you 3 different medications to help alleviate symptoms.  Please follow-up if symptoms persist or worsen.    ED Prescriptions     Medication Sig Dispense Auth. Provider   predniSONE (DELTASONE) 20 MG tablet Take 2 tablets (40 mg total) by mouth daily for 5 days. 10 tablet [place], Rolly Salter E, [place]   albuterol (VENTOLIN HFA) 108 (90 Base) MCG/ACT inhaler Inhale 1-2 puffs into the lungs every 6 (six) hours as needed for wheezing or shortness of breath. 1 each [place], [place] E, [place]   benzonatate (TESSALON) 100 MG capsule Take 1 capsule (100 mg total) by mouth every 8 (eight) hours as needed for cough. 21 capsule [place], Acie Fredrickson, [place]      PDMP not reviewed this encounter.   Gustavus Bryant, [place] 05/11/22 1339

## 2022-05-11 NOTE — Discharge Instructions (Signed)
It appears that you have viral bronchitis. I have prescribed you 3 different medications to help alleviate symptoms.  Please follow-up if symptoms persist or worsen.

## 2022-05-17 ENCOUNTER — Ambulatory Visit
Admission: EM | Admit: 2022-05-17 | Discharge: 2022-05-17 | Disposition: A | Discharge status: Home / Self Care | Payer: 59 | Attending: Physician Assistant | Admitting: Physician Assistant

## 2022-05-17 ENCOUNTER — Encounter: Payer: Self-pay | Admitting: Emergency Medicine

## 2022-05-17 DIAGNOSIS — R062 Wheezing: Secondary | ICD-10-CM

## 2022-05-17 DIAGNOSIS — J209 Acute bronchitis, unspecified: Secondary | ICD-10-CM | POA: Diagnosis not present

## 2022-05-17 DIAGNOSIS — J069 Acute upper respiratory infection, unspecified: Secondary | ICD-10-CM | POA: Diagnosis not present

## 2022-05-17 DIAGNOSIS — R051 Acute cough: Secondary | ICD-10-CM | POA: Diagnosis not present

## 2022-05-17 MED ORDER — AZITHROMYCIN 250 MG PO TABS: ORAL_TABLET | ORAL | 0 refills | Status: DC

## 2022-05-17 MED ORDER — PREDNISONE 10 MG PO TABS: 10.0000 mg | ORAL_TABLET | Freq: Three times a day (TID) | ORAL | 0 refills | Status: DC

## 2022-05-17 MED ORDER — DM-GUAIFENESIN ER 30-600 MG PO TB12: 1.0000 | ORAL_TABLET | Freq: Two times a day (BID) | ORAL | 0 refills | Status: DC

## 2022-05-17 NOTE — Discharge Instructions (Signed)
Advised to continue to use the albuterol inhaler, 2 puffs every 6 hours on a regular basis to decrease wheezing and cough. Advised to take the Zithromax tablets, 2 tablets initially and then 1 daily until completed. Advised take prednisone 10 mg every 8 hours until completed. Advised take the Mucinex DM every 12 hours to help control cough and congestion.  Advised to avoid chemical exposure as much as possible as breathing chemical fumes will continue to aggravate and increase the cough and wheezing episodes.

## 2022-05-17 NOTE — ED Provider Notes (Signed)
[name]    CSN: [id number] Arrival date & time: 05/17/22  4599      History   Chief Complaint Chief Complaint  Patient presents with   Cough   Sore Throat    HPI Amanda Farley is a 41 y.o. female.   41 year old female presents with cough and wheezing.  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  She indicates that she tried to go to work yesterday and this aggravated her cough and she went into some coughing fits along with increased wheezing.  Patient indicates for the past 10 days she has been having persistent upper respiratory congestion with postnasal drip and rhinitis which is mainly been clear.  She indicates she continues to have coughing and chest congestion which has been intermittent with wheezing associated which is worse with activity.  Patient indicates that her production has been thick and has been dark yellow.  Patient denies any fever or chills.  Patient does relate that her coughing and wheezing is worse with activity.  She indicates trying to exercise yesterday but she was not able to do so because she started having a coughing and wheezing event.  Patient indicates she has taken the medicine as directed and she is using the albuterol inhaler with 2 puffs every 6 hours which only works intermittently.  Patient is concerned that she still has the chest congestion with wheezing.   Cough Associated symptoms: shortness of breath and wheezing   Sore Throat Associated symptoms include shortness of breath.    Past Medical History:  Diagnosis Date   Hearing loss     Patient Active Problem List   Diagnosis Date Noted   COVID-19 11/03/2020   Ovarian cyst 11/26/2015    Past Surgical History:  Procedure Laterality Date   INNER EAR SURGERY Left 2014   LAPAROSCOPIC APPENDECTOMY N/A 05/08/2015   Procedure: APPENDECTOMY LAPAROSCOPIC;  Surgeon: Darnell Level, MD;  Location: [facility];  Service: General;  Laterality: N/A;   Left TM      OB History     Gravida  2   Para  2   Term  1   Preterm  1   AB  0   Living  2      SAB  0   IAB  0   Ectopic  0   Multiple  0   Live Births  2            Home Medications    Prior to Admission medications   Medication Sig Start Date End Date Taking? Authorizing Provider  azithromycin (ZITHROMAX Z-PAK) 250 MG tablet Take 2 tablets initially, then 1 daily until finished. 05/17/22  Yes Ellsworth Lennox, PA-C  dextromethorphan-guaiFENesin [facility] DM) 30-600 MG 12hr tablet Take 1 tablet by mouth 2 (two) times daily. 05/17/22  Yes Ellsworth Lennox, PA-C  predniSONE (DELTASONE) 10 MG tablet Take 1 tablet (10 mg total) by mouth in the morning, at noon, and at bedtime. 05/17/22  Yes Ellsworth Lennox, PA-C  albuterol (VENTOLIN HFA) 108 (90 Base) MCG/ACT inhaler Inhale 1-2 puffs into the lungs every 6 (six) hours as needed for wheezing or shortness of breath. 05/11/22   Gustavus Bryant, FNP  benzonatate (TESSALON) 100 MG capsule Take 1 capsule (100 mg total) by mouth every 8 (eight) hours as needed for cough. 05/11/22   Gustavus Bryant, FNP  ibuprofen (ADVIL) 800 MG tablet Take 1  tablet (800 mg total) by mouth every 8 (eight) hours as needed for up to 21 doses for fever, headache, mild pain or moderate pain. 10/16/21   Theadora Rama Scales, PA-C    Family History Family History  Family history unknown: Yes    Social History Social History   Tobacco Use   Smoking status: Never   Smokeless tobacco: Never  Substance Use Topics   Alcohol use: No   Drug use: No     Allergies   Patient has no known allergies.   Review of Systems Review of Systems  HENT:  Positive for postnasal drip.   Respiratory:  Positive for cough, shortness of breath and wheezing.      Physical Exam Triage Vital Signs ED Triage Vitals [05/17/22 1048]  Enc Vitals Group     BP 109/73     Pulse Rate 66     Resp 16     Temp 98.3 F (36.8 C)      Temp src      SpO2 99 %     Weight      Height      Head Circumference      Peak Flow      Pain Score 0     Pain Loc      Pain Edu?      Excl. in [place]?    No data found.  Updated Vital Signs BP 109/73   Pulse 66   Temp 98.3 F (36.8 C)   Resp 16   SpO2 99%   Visual Acuity Right Eye Distance:   Left Eye Distance:   Bilateral Distance:    Right Eye Near:   Left Eye Near:    Bilateral Near:     Physical Exam Constitutional:      Appearance: She is well-developed.  HENT:     Right Ear: Ear canal normal. Tympanic membrane is injected.     Left Ear: Ear canal normal. Tympanic membrane is injected.     Mouth/Throat:     Mouth: Mucous membranes are moist.     Pharynx: Oropharynx is clear. No posterior oropharyngeal erythema.  Cardiovascular:     Rate and Rhythm: Normal rate and regular rhythm.     Heart sounds: Normal heart sounds.  Pulmonary:     Effort: Pulmonary effort is normal.     Breath sounds: Normal breath sounds and air entry. No wheezing, rhonchi or rales.  Lymphadenopathy:     Cervical: No cervical adenopathy.  Neurological:     Mental Status: She is alert.      UC Treatments / Results  Labs (all labs ordered are listed, but only abnormal results are displayed) Labs Reviewed - No data to display  EKG   Radiology No results found.  Procedures Procedures (including critical care time)  Medications Ordered in UC Medications - No data to display  Initial Impression / Assessment and Plan / UC Course  I have reviewed the triage vital signs and the nursing notes.  Pertinent labs & imaging results that were available during my care of the patient were reviewed by me and considered in my medical decision making (see chart for details).    Plan: 1.  The acute upper respiratory infection will be treated the following: A.  Mucinex DM every 12 hours to treat cough and congestion. 2.  The acute cough will be treated with the following: A.   Mucinex DM every 12 hours to control cough and congestion. 3.  The acute bronchitis will be treated with the following: A.  Mucinex DM every 12 hours to control cough and congestion. B.  Zithromax, 2 tablets initially then 1 daily until completed. 4.  The acute wheezing will be treated with the following: A.  Patient advised to continue albuterol inhaler, 2 puffs every 6 hours on a regular basis to control wheezing and cough. B.  Prednisone 10 mg every 8 hours until completed to reduce the respiratory inflammatory component. 5.  Patient advised to avoid working and being exposed to chemicals as this will prolong and exacerbate the bronchitis and wheezing episodes. 6.  Patient advised follow-up PCP or return to urgent care if symptoms fail to improve. Final Clinical Impressions(s) / UC Diagnoses   Final diagnoses:  Acute upper respiratory infection  Acute cough  Acute bronchitis, unspecified organism  Wheezing     Discharge Instructions      Advised to continue to use the albuterol inhaler, 2 puffs every 6 hours on a regular basis to decrease wheezing and cough. Advised to take the Zithromax tablets, 2 tablets initially and then 1 daily until completed. Advised take prednisone 10 mg every 8 hours until completed. Advised take the Mucinex DM every 12 hours to help control cough and congestion.  Advised to avoid chemical exposure as much as possible as breathing chemical fumes will continue to aggravate and increase the cough and wheezing episodes.    ED Prescriptions     Medication Sig Dispense Auth. Provider   azithromycin (ZITHROMAX Z-PAK) 250 MG tablet Take 2 tablets initially, then 1 daily until finished. 6 each Ellsworth Lennox, PA-C   predniSONE (DELTASONE) 10 MG tablet Take 1 tablet (10 mg total) by mouth in the morning, at noon, and at bedtime. 15 tablet Ellsworth Lennox, PA-C   dextromethorphan-guaiFENesin [facility] DM) 30-600 MG 12hr tablet Take 1 tablet by mouth 2 (two) times  daily. 20 tablet Ellsworth Lennox, PA-C      PDMP not reviewed this encounter.   Ellsworth Lennox, PA-C 05/17/22 1118

## 2022-05-17 NOTE — ED Triage Notes (Signed)
Pt is present today with productive cough and wheezing. Pt states that her sx haven't gotten any better since her last visit.

## 2022-06-06 DIAGNOSIS — Z419 Encounter for procedure for purposes other than remedying health state, unspecified: Secondary | ICD-10-CM | POA: Diagnosis not present

## 2022-06-10 ENCOUNTER — Ambulatory Visit (INDEPENDENT_AMBULATORY_CARE_PROVIDER_SITE_OTHER): Payer: 59 | Admitting: Nurse Practitioner

## 2022-06-10 VITALS — BP 100/64 | HR 83 | Temp 98.0°F | Ht 62.0 in | Wt 128.1 lb

## 2022-06-10 DIAGNOSIS — U071 COVID-19: Secondary | ICD-10-CM | POA: Diagnosis not present

## 2022-06-10 LAB — POCT INFLUENZA A/B
Influenza A, POC: NEGATIVE
Influenza B, POC: NEGATIVE

## 2022-06-10 LAB — POCT RAPID STREP A (OFFICE): Rapid Strep A Screen: NEGATIVE

## 2022-06-10 LAB — POC COVID19 BINAXNOW: SARS Coronavirus 2 Ag: POSITIVE — AB

## 2022-06-10 MED ORDER — BENZONATATE 200 MG PO CAPS: 200.0000 mg | ORAL_CAPSULE | Freq: Two times a day (BID) | ORAL | 0 refills | Status: DC | PRN

## 2022-06-10 MED ORDER — FLUTICASONE PROPIONATE 50 MCG/ACT NA SUSP: 2.0000 | Freq: Every day | NASAL | 6 refills | Status: DC

## 2022-06-10 NOTE — Assessment & Plan Note (Signed)
Acute, no significant medical history suggesting patient has significant risk for severe disease.  Has been vaccinated x 2.  Vital signs stable today.  Recommend symptomatic treatment with rest, hydration, Tessalon Perles, Flonase nasal spray.  Patient educated on current isolation guidelines.  Patient educated to call office if symptoms persist or to call [phone] if symptoms become severe.

## 2022-06-10 NOTE — Progress Notes (Signed)
   Established Patient Office Visit  Subjective   Patient ID: Amanda Farley, female    DOB: Sep 21, 1980  Age: 42 y.o. MRN: [id number]  Chief Complaint  Patient presents with   Cough    Symptom onset 3 days ago feeling fatigued, malaise, cough, muscle aches, headache.  No shortness of breath.  No fever.  Has been vaccinated for COVID x 2.  Patient has no significant medical history of lung disease, diabetes, hypertension, cancer.    Review of Systems  Constitutional:  Positive for malaise/fatigue. Negative for fever.  Respiratory:  Positive for cough.   Musculoskeletal:  Positive for myalgias.  Neurological:  Positive for dizziness and headaches.      Objective:     BP 100/64   Pulse 83   Temp 98 F (36.7 C) (Oral)   Ht 5\' 2"  (1.575 m)   Wt 128 lb 2 oz (58.1 kg)   SpO2 99%   BMI 23.43 kg/m    Physical Exam Constitutional:      Appearance: She is not toxic-appearing or diaphoretic.  HENT:     Head: Normocephalic.  Pulmonary:     Effort: Pulmonary effort is normal.  Neurological:     General: No focal deficit present.     Mental Status: She is alert and oriented to person, place, and time.      Results for orders placed or performed in visit on 06/10/22  POCT rapid strep A  Result Value Ref Range   Rapid Strep A Screen Negative Negative  POC COVID-19  Result Value Ref Range   SARS Coronavirus 2 Ag Positive (A) Negative  POCT Influenza A/B  Result Value Ref Range   Influenza A, POC Negative Negative   Influenza B, POC Negative Negative      The ASCVD Risk score (Arnett DK, et al., 2019) failed to calculate for the following reasons:   Cannot find a previous HDL lab   Cannot find a previous total cholesterol lab    Assessment & Plan:   Problem List Items Addressed This Visit       Other   COVID-19 - Primary    Acute, no significant medical history suggesting patient has significant risk for severe disease.  Has been vaccinated x 2.  Vital signs  stable today.  Recommend symptomatic treatment with rest, hydration, Tessalon Perles, Flonase nasal spray.  Patient educated on current isolation guidelines.  Patient educated to call office if symptoms persist or to call 9 1 if symptoms become severe.      Relevant Medications   benzonatate (TESSALON) 200 MG capsule   fluticasone (FLONASE) 50 MCG/ACT nasal spray   Other Relevant Orders   POCT rapid strep A (Completed)   POC COVID-19 (Completed)   POCT Influenza A/B (Completed)    Return if symptoms worsen or fail to improve.    Ailene Ards, NP

## 2022-06-20 ENCOUNTER — Encounter: Payer: Self-pay | Admitting: Internal Medicine

## 2022-06-20 ENCOUNTER — Ambulatory Visit (INDEPENDENT_AMBULATORY_CARE_PROVIDER_SITE_OTHER): Payer: 59 | Admitting: Internal Medicine

## 2022-06-20 VITALS — BP 100/70 | HR 73 | Temp 98.9°F | Ht 62.0 in | Wt 131.2 lb

## 2022-06-20 DIAGNOSIS — Z23 Encounter for immunization: Secondary | ICD-10-CM | POA: Diagnosis not present

## 2022-06-20 DIAGNOSIS — Z0000 Encounter for general adult medical examination without abnormal findings: Secondary | ICD-10-CM | POA: Diagnosis not present

## 2022-06-20 LAB — CBC
HCT: 39.3 % (ref 36.0–46.0)
Hemoglobin: 12.9 g/dL (ref 12.0–15.0)
MCHC: 32.8 g/dL (ref 30.0–36.0)
MCV: 85.6 fl (ref 78.0–100.0)
Platelets: 208 10*3/uL (ref 150.0–400.0)
RBC: 4.59 Mil/uL (ref 3.87–5.11)
RDW: 13.6 % (ref 11.5–15.5)
WBC: 6.3 10*3/uL (ref 4.0–10.5)

## 2022-06-20 LAB — COMPREHENSIVE METABOLIC PANEL
ALT: 18 U/L (ref 0–35)
AST: 22 U/L (ref 0–37)
Albumin: 4.3 g/dL (ref 3.5–5.2)
Alkaline Phosphatase: 34 U/L — ABNORMAL LOW (ref 39–117)
BUN: 22 mg/dL (ref 6–23)
CO2: 24 mEq/L (ref 19–32)
Calcium: 8.9 mg/dL (ref 8.4–10.5)
Chloride: 106 mEq/L (ref 96–112)
Creatinine, Ser: 0.66 mg/dL (ref 0.40–1.20)
GFR: 107.35 mL/min (ref >60.00)
Glucose, Bld: 94 mg/dL (ref 70–99)
Potassium: 4.5 mEq/L (ref 3.5–5.1)
Sodium: 137 mEq/L (ref 135–145)
Total Bilirubin: 0.5 mg/dL (ref 0.2–1.2)
Total Protein: 7 g/dL (ref 6.0–8.3)

## 2022-06-20 LAB — LIPID PANEL
Cholesterol: 233 mg/dL — ABNORMAL HIGH (ref 0–200)
HDL: 46.1 mg/dL (ref >39.00)
LDL Cholesterol: 169 mg/dL — ABNORMAL HIGH (ref 0–99)
NonHDL: 186.42
Total CHOL/HDL Ratio: 5
Triglycerides: 88 mg/dL (ref 0.0–149.0)
VLDL: 17.6 mg/dL (ref 0.0–40.0)

## 2022-06-20 LAB — HEMOGLOBIN A1C: Hgb A1c MFr Bld: 5.4 % (ref 4.6–6.5)

## 2022-06-20 MED ORDER — TRIAMCINOLONE ACETONIDE 0.1 % EX CREA: 1.0000 | TOPICAL_CREAM | Freq: Two times a day (BID) | CUTANEOUS | 0 refills | Status: DC

## 2022-06-20 NOTE — Assessment & Plan Note (Signed)
Flu shot given. Covid-19 counseled. Tetanus counseled. Mammogram with gyn, pap smear with gyn up to date. Counseled about sun safety and mole surveillance. Counseled about the dangers of distracted driving. Given 10 year screening recommendations.

## 2022-06-20 NOTE — Progress Notes (Signed)
   Subjective:   Patient ID: Amanda Farley, female    DOB: 1981/03/15, 42 y.o.   MRN: [id number]  HPI The patient is a 42 YO female coming in for physical.   PMH, [place], social history reviewed and updated  Review of Systems  Constitutional: Negative.   HENT: Negative.    Eyes: Negative.   Respiratory:  Negative for cough, chest tightness and shortness of breath.   Cardiovascular:  Negative for chest pain, palpitations and leg swelling.  Gastrointestinal:  Negative for abdominal distention, abdominal pain, constipation, diarrhea, nausea and vomiting.  Musculoskeletal: Negative.   Skin: Negative.   Neurological: Negative.   Psychiatric/Behavioral: Negative.      Objective:  Physical Exam Constitutional:      Appearance: She is well-developed.  HENT:     Head: Normocephalic and atraumatic.  Cardiovascular:     Rate and Rhythm: Normal rate and regular rhythm.  Pulmonary:     Effort: Pulmonary effort is normal. No respiratory distress.     Breath sounds: Normal breath sounds. No wheezing or rales.  Abdominal:     General: Bowel sounds are normal. There is no distension.     Palpations: Abdomen is soft.     Tenderness: There is no abdominal tenderness. There is no rebound.  Musculoskeletal:     Cervical back: Normal range of motion.  Skin:    General: Skin is warm and dry.  Neurological:     Mental Status: She is alert and oriented to person, place, and time.     Coordination: Coordination normal.     Vitals:   06/20/22 0845  BP: 100/70  Pulse: 73  Temp: 98.9 F (37.2 C)  TempSrc: Oral  SpO2: 99%  Weight: 131 lb 4 oz (59.5 kg)  Height: 5\' 2"  (1.575 m)    Assessment & Plan:  Flu shot given at visit

## 2022-07-07 DIAGNOSIS — Z419 Encounter for procedure for purposes other than remedying health state, unspecified: Secondary | ICD-10-CM | POA: Diagnosis not present

## 2022-07-26 ENCOUNTER — Ambulatory Visit
Admission: EM | Admit: 2022-07-26 | Discharge: 2022-07-26 | Disposition: A | Discharge status: Home / Self Care | Payer: 59 | Attending: Internal Medicine | Admitting: Internal Medicine

## 2022-07-26 DIAGNOSIS — J0140 Acute pansinusitis, unspecified: Secondary | ICD-10-CM | POA: Diagnosis not present

## 2022-07-26 MED ORDER — AMOXICILLIN-POT CLAVULANATE 875-125 MG PO TABS: 1.0000 | ORAL_TABLET | Freq: Two times a day (BID) | ORAL | 0 refills | Status: DC

## 2022-07-26 NOTE — ED Triage Notes (Signed)
Pt reports nasal congestion x 1 week. Pt is taking Day-Quil.

## 2022-07-26 NOTE — Discharge Instructions (Signed)
I have prescribed an antibiotic for upper respiratory infection.  Please follow-up with ENT specialty given recurrent upper respiratory infection.

## 2022-07-26 NOTE — ED Provider Notes (Addendum)
**Note Amanda-Identified via Obfuscation** [name]    CSN: [id number] Arrival date & time: 07/26/22  1614      History   Chief Complaint Chief Complaint  Patient presents with   Nasal Congestion    HPI Amanda Farley is a 42 y.o. female.   Patient presents with nasal congestion that has been present for 4 weeks.  She reports that over the past week, she has noticed that her mucus has become thicker and greener.  She denies any associated cough or fever.  Denies any known sick contacts.  Reports that she has not seen a doctor since symptoms started.  Denies chest pain or shortness of breath.  Has taken DayQuil and Flonase with no improvement in symptoms.     Past Medical History:  Diagnosis Date   Hearing loss     Patient Active Problem List   Diagnosis Date Noted   Routine general medical examination at a health care facility 06/20/2022   Ovarian cyst 11/26/2015    Past Surgical History:  Procedure Laterality Date   INNER EAR SURGERY Left 2014   LAPAROSCOPIC APPENDECTOMY N/A 05/08/2015   Procedure: APPENDECTOMY LAPAROSCOPIC;  Surgeon: Armandina Gemma, MD;  Location: [facility];  Service: General;  Laterality: N/A;   Left TM      OB History     Gravida  2   Para  2   Term  1   Preterm  1   AB  0   Living  2      SAB  0   IAB  0   Ectopic  0   Multiple  0   Live Births  2            Home Medications    Prior to Admission medications   Medication Sig Start Date End Date Taking? Authorizing Provider  amoxicillin-clavulanate (AUGMENTIN) 875-125 MG tablet Take 1 tablet by mouth every 12 (twelve) hours. 07/26/22  Yes Yuleimy Kretz, Michele Rockers, FNP  albuterol (VENTOLIN HFA) 108 (90 Base) MCG/ACT inhaler Inhale 1-2 puffs into the lungs every 6 (six) hours as needed for wheezing or shortness of breath. 05/11/22   Teodora Medici, FNP  benzonatate (TESSALON) 200 MG capsule Take 1 capsule (200 mg total) by mouth 2 (two) times daily as needed for cough. 06/10/22   Ailene Ards, NP   dextromethorphan-guaiFENesin [facility] DM) 30-600 MG 12hr tablet Take 1 tablet by mouth 2 (two) times daily. 05/17/22   Nyoka Lint, PA-C  fluticasone [facility]) 50 MCG/ACT nasal spray Place 2 sprays into both nostrils daily. 06/10/22   Ailene Ards, NP  ibuprofen (ADVIL) 800 MG tablet Take 1 tablet (800 mg total) by mouth every 8 (eight) hours as needed for up to 21 doses for fever, headache, mild pain or moderate pain. 10/16/21   Lynden Oxford Scales, PA-C  triamcinolone cream (KENALOG) 0.1 % Apply 1 Application topically 2 (two) times daily. 06/20/22   Hoyt Koch, MD    Family History Family History  Family history unknown: Yes    Social History Social History   Tobacco Use   Smoking status: Never   Smokeless tobacco: Never  Substance Use Topics   Alcohol use: No   Drug use: No     Allergies   Patient has no known allergies.   Review of Systems Review of Systems Per HPI  Physical Exam Triage Vital Signs ED Triage Vitals [07/26/22 1623]  Enc Vitals Group     BP 113/60     Pulse  Rate (!) 118     Resp 16     Temp 98.6 F (37 C)     Temp Source Oral     SpO2 98 %     Weight      Height      Head Circumference      Peak Flow      Pain Score      Pain Loc      Pain Edu?      Excl. in [place]?    No data found.  Updated Vital Signs BP 113/60 (BP Location: Left Arm)   Pulse 82   Temp 98.6 F (37 C) (Oral)   Resp 16   SpO2 98%   Visual Acuity Right Eye Distance:   Left Eye Distance:   Bilateral Distance:    Right Eye Near:   Left Eye Near:    Bilateral Near:     Physical Exam Constitutional:      General: She is not in acute distress.    Appearance: Normal appearance. She is not toxic-appearing or diaphoretic.  HENT:     Head: Normocephalic and atraumatic.     Right Ear: Tympanic membrane and ear canal normal.     Left Ear: Tympanic membrane and ear canal normal.     Nose: Congestion present.     Right Sinus: Maxillary sinus tenderness  and frontal sinus tenderness present.     Left Sinus: Maxillary sinus tenderness and frontal sinus tenderness present.     Mouth/Throat:     Mouth: Mucous membranes are moist.     Pharynx: Posterior oropharyngeal erythema present.  Eyes:     Extraocular Movements: Extraocular movements intact.     Conjunctiva/sclera: Conjunctivae normal.     Pupils: Pupils are equal, round, and reactive to light.  Cardiovascular:     Rate and Rhythm: Normal rate and regular rhythm.     Pulses: Normal pulses.     Heart sounds: Normal heart sounds.  Pulmonary:     Effort: Pulmonary effort is normal. No respiratory distress.     Breath sounds: Normal breath sounds. No wheezing.  Abdominal:     General: Abdomen is flat. Bowel sounds are normal.     Palpations: Abdomen is soft.  Musculoskeletal:        General: Normal range of motion.     Cervical back: Normal range of motion.  Skin:    General: Skin is warm and dry.  Neurological:     General: No focal deficit present.     Mental Status: She is alert and oriented to person, place, and time. Mental status is at baseline.  Psychiatric:        Mood and Affect: Mood normal.        Behavior: Behavior normal.      UC Treatments / Results  Labs (all labs ordered are listed, but only abnormal results are displayed) Labs Reviewed - No data to display  EKG   Radiology No results found.  Procedures Procedures (including critical care time)  Medications Ordered in UC Medications - No data to display  Initial Impression / Assessment and Plan / UC Course  I have reviewed the triage vital signs and the nursing notes.  Pertinent labs & imaging results that were available during my care of the patient were reviewed by me and considered in my medical decision making (see chart for details).     I am concerned for sinus infection given duration of symptoms and change  in mucus. Will treat with Augmentin antibiotic.  There are no adventitious lung  sounds on exam so do not think that chest imaging is necessary at this time.  I noticed that patient has been intermittently sick over the past few months and encouraged patient to follow-up with provided contact information for ENT specialty.  Advised supportive care.  Advised strict return precautions.  Patient verbalized understanding and was agreeable with plan.  Suspect patient's initial triage heart rate was a fluke as physical exam heart rate was normal and recheck was normal.  Therefore, do not think any further workup is necessary for this. Final Clinical Impressions(s) / UC Diagnoses   Final diagnoses:  Acute non-recurrent pansinusitis     Discharge Instructions      I have prescribed an antibiotic for upper respiratory infection.  Please follow-up with ENT specialty given recurrent upper respiratory infection.     ED Prescriptions     Medication Sig Dispense Auth. Provider   amoxicillin-clavulanate (AUGMENTIN) 875-125 MG tablet Take 1 tablet by mouth every 12 (twelve) hours. 14 tablet [place], Michele Rockers, [place]      PDMP not reviewed this encounter.   Teodora Medici, [place] 07/26/22 [place], [place], [place] 07/26/22 [phone]

## 2022-08-05 DIAGNOSIS — Z419 Encounter for procedure for purposes other than remedying health state, unspecified: Secondary | ICD-10-CM | POA: Diagnosis not present

## 2022-09-05 DIAGNOSIS — Z419 Encounter for procedure for purposes other than remedying health state, unspecified: Secondary | ICD-10-CM | POA: Diagnosis not present

## 2022-10-05 DIAGNOSIS — Z419 Encounter for procedure for purposes other than remedying health state, unspecified: Secondary | ICD-10-CM | POA: Diagnosis not present

## 2022-11-24 ENCOUNTER — Encounter: Payer: Self-pay | Admitting: Family Medicine

## 2022-11-24 ENCOUNTER — Ambulatory Visit (INDEPENDENT_AMBULATORY_CARE_PROVIDER_SITE_OTHER): Payer: 59 | Admitting: Family Medicine

## 2022-11-24 VITALS — BP 106/70 | HR 65 | Temp 97.6°F | Ht 62.0 in | Wt 121.0 lb

## 2022-11-24 DIAGNOSIS — R21 Rash and other nonspecific skin eruption: Secondary | ICD-10-CM | POA: Diagnosis not present

## 2022-11-24 MED ORDER — TRIAMCINOLONE ACETONIDE 0.025 % EX OINT: 1.0000 | TOPICAL_OINTMENT | Freq: Two times a day (BID) | CUTANEOUS | 0 refills | Status: DC

## 2022-11-24 MED ORDER — MUPIROCIN 2 % EX OINT: 1.0000 | TOPICAL_OINTMENT | Freq: Two times a day (BID) | CUTANEOUS | 0 refills | Status: DC

## 2022-11-24 NOTE — Patient Instructions (Addendum)
Use warm compresses on the area 2-3 times per day.   Alternate with the Bactroban and triamcinolone twice daily.   Use the steroid (triamcinolone) in afternoon and and at bedtime (best for itching).   Try to avoid scratching.   You may take Benadryl over the counter if needed at bedtime for itching but be careful since this medication causes drowsiness.   Follow up with the area is getting larger.

## 2022-11-24 NOTE — Progress Notes (Signed)
Subjective:     Patient ID: Amanda Farley, female    DOB: [birth year], 42 y.o.   MRN: [id number]  Chief Complaint  Patient presents with   Rash    Itching rash on stomach, started on hand but then was on stomach. Started 2 weeks ago    Rash Pertinent negatives include no congestion, cough, diarrhea, fever, joint pain, shortness of breath or vomiting.    Discussed the use of AI scribe software for clinical note transcription with the patient, who gave verbal consent to proceed.  History of Present Illness         Rash on stomach, possible insect bite x 2 weeks. The area is getting smaller but itching. She also had a similar area on her right wrist that has since resolved.   She is not using anything for symptoms.   No other rashes.      Health Maintenance Due  Topic Date Due   COVID-19 Vaccine (1) Never done   Hepatitis C Screening  Never done   DTaP/Tdap/Td (1 - Tdap) Never done   PAP SMEAR-Modifier  08/26/2019    Past Medical History:  Diagnosis Date   Hearing loss     Past Surgical History:  Procedure Laterality Date   INNER EAR SURGERY Left 2014   LAPAROSCOPIC APPENDECTOMY N/A 05/08/2015   Procedure: APPENDECTOMY LAPAROSCOPIC;  Surgeon: Darnell Level, MD;  Location: [facility];  Service: General;  Laterality: N/A;   Left TM      Family History  Family history unknown: Yes    Social History   Socioeconomic History   Marital status: Single    Spouse name: Not on file   Number of children: Not on file   Years of education: Not on file   Highest education level: Not on file  Occupational History   Not on file  Tobacco Use   Smoking status: Never   Smokeless tobacco: Never  Substance and Sexual Activity   Alcohol use: No   Drug use: No   Sexual activity: Yes    Birth control/protection: None  Other Topics Concern   Not on file  Social History Narrative   ** Merged History Encounter **       Social Determinants of Health   Financial Resource  Strain: Not on file  Food Insecurity: Not on file  Transportation Needs: Not on file  Physical Activity: Not on file  Stress: Not on file  Social Connections: Not on file  Intimate Partner Violence: Not on file    Outpatient Medications Prior to Visit  Medication Sig Dispense Refill   albuterol (VENTOLIN HFA) 108 (90 Base) MCG/ACT inhaler Inhale 1-2 puffs into the lungs every 6 (six) hours as needed for wheezing or shortness of breath. 1 each 0   fluticasone (FLONASE) 50 MCG/ACT nasal spray Place 2 sprays into both nostrils daily. 16 g 6   ibuprofen (ADVIL) 800 MG tablet Take 1 tablet (800 mg total) by mouth every 8 (eight) hours as needed for up to 21 doses for fever, headache, mild pain or moderate pain. 21 tablet 0   triamcinolone cream (KENALOG) 0.1 % Apply 1 Application topically 2 (two) times daily. 100 g 0   amoxicillin-clavulanate (AUGMENTIN) 875-125 MG tablet Take 1 tablet by mouth every 12 (twelve) hours. (Patient not taking: Reported on 11/24/2022) 14 tablet 0   benzonatate (TESSALON) 200 MG capsule Take 1 capsule (200 mg total) by mouth 2 (two) times daily as needed for cough. (Patient not taking:  Reported on 11/24/2022) 20 capsule 0   dextromethorphan-guaiFENesin (MUCINEX DM) 30-600 MG 12hr tablet Take 1 tablet by mouth 2 (two) times daily. (Patient not taking: Reported on 11/24/2022) 20 tablet 0   No facility-administered medications prior to visit.    No Known Allergies  Review of Systems  Constitutional:  Negative for chills and fever.  HENT:  Negative for congestion.   Respiratory:  Negative for cough and shortness of breath.   Cardiovascular:  Negative for chest pain and palpitations.  Gastrointestinal:  Negative for abdominal pain, diarrhea, nausea and vomiting.  Musculoskeletal:  Negative for joint pain, myalgias and neck pain.  Skin:  Positive for rash.  Neurological:  Negative for dizziness, sensory change, weakness and headaches.       Objective:    Physical  Exam Constitutional:      General: She is not in acute distress.    Appearance: She is not ill-appearing.  HENT:     Mouth/Throat:     Pharynx: Oropharynx is clear.  Eyes:     Extraocular Movements: Extraocular movements intact.     Conjunctiva/sclera: Conjunctivae normal.  Cardiovascular:     Rate and Rhythm: Normal rate.  Pulmonary:     Effort: Pulmonary effort is normal.     Breath sounds: Normal breath sounds.  Musculoskeletal:     Cervical back: Normal range of motion and neck supple.  Skin:    General: Skin is warm and dry.     Findings: Rash present.     Comments: Papule with central punctum surrounded by a 0.5 cm flat circular area of erythema, pruritic.  No induration, fluctuance or exudate.   Neurological:     General: No focal deficit present.     Mental Status: She is alert and oriented to person, place, and time.  Psychiatric:        Mood and Affect: Mood normal.        Behavior: Behavior normal.        Thought Content: Thought content normal.      BP 106/70 (BP Location: Left Arm, Patient Position: Sitting, Cuff Size: Normal)   Pulse 65   Temp 97.6 F (36.4 C) (Temporal)   Ht 5\' 2"  (1.575 m)   Wt 121 lb (54.9 kg)   SpO2 99%   BMI 22.13 kg/m  Wt Readings from Last 3 Encounters:  11/24/22 121 lb (54.9 kg)  06/20/22 131 lb 4 oz (59.5 kg)  06/10/22 128 lb 2 oz (58.1 kg)       Assessment & Plan:   Problem List Items Addressed This Visit   None Visit Diagnoses     Rash and nonspecific skin eruption    -  Primary   Relevant Medications   mupirocin ointment (BACTROBAN) 2 %   triamcinolone (KENALOG) 0.025 % ointment      Possible insect bite with localized reaction. No systemic symptoms.  Treat topically and follow up if any new or worsening symptoms.  Discussed cool compresses and Benadryl prn for itching.   I have discontinued Kiyonna Sesler's dextromethorphan-guaiFENesin, benzonatate, triamcinolone cream, and amoxicillin-clavulanate. I am also  having her start on mupirocin ointment and triamcinolone. Additionally, I am having her maintain her ibuprofen, albuterol, and fluticasone.  Meds ordered this encounter  Medications   mupirocin ointment (BACTROBAN) 2 %    Sig: Apply 1 Application topically 2 (two) times daily.    Dispense:  22 g    Refill:  0    Order Specific Question:  Supervising Provider    Answer:   Hillard Danker A [[id number]]   triamcinolone (KENALOG) 0.025 % ointment    Sig: Apply 1 Application topically 2 (two) times daily.    Dispense:  30 g    Refill:  0    Order Specific Question:   Supervising Provider    Answer:   Hillard Danker A [[id number]]

## 2023-01-04 DIAGNOSIS — Z6821 Body mass index (BMI) 21.0-21.9, adult: Secondary | ICD-10-CM | POA: Diagnosis not present

## 2023-01-04 DIAGNOSIS — Z1231 Encounter for screening mammogram for malignant neoplasm of breast: Secondary | ICD-10-CM | POA: Diagnosis not present

## 2023-01-04 DIAGNOSIS — Z01419 Encounter for gynecological examination (general) (routine) without abnormal findings: Secondary | ICD-10-CM | POA: Diagnosis not present

## 2023-01-09 ENCOUNTER — Other Ambulatory Visit: Payer: Self-pay | Admitting: Obstetrics and Gynecology

## 2023-01-09 DIAGNOSIS — R928 Other abnormal and inconclusive findings on diagnostic imaging of breast: Secondary | ICD-10-CM

## 2023-01-18 ENCOUNTER — Other Ambulatory Visit: Payer: Self-pay | Admitting: Obstetrics and Gynecology

## 2023-01-18 ENCOUNTER — Ambulatory Visit
Admission: RE | Admit: 2023-01-18 | Discharge: 2023-01-18 | Disposition: A | Discharge status: Home / Self Care | Payer: 59 | Source: Ambulatory Visit | Attending: Obstetrics and Gynecology | Admitting: Obstetrics and Gynecology

## 2023-01-18 DIAGNOSIS — R928 Other abnormal and inconclusive findings on diagnostic imaging of breast: Secondary | ICD-10-CM

## 2023-01-18 DIAGNOSIS — N6489 Other specified disorders of breast: Secondary | ICD-10-CM

## 2023-04-12 ENCOUNTER — Encounter: Payer: Self-pay | Admitting: Internal Medicine

## 2023-04-12 ENCOUNTER — Ambulatory Visit (INDEPENDENT_AMBULATORY_CARE_PROVIDER_SITE_OTHER): Payer: 59 | Admitting: Internal Medicine

## 2023-04-12 VITALS — BP 100/68 | HR 57 | Temp 98.0°F | Ht 62.0 in | Wt 127.0 lb

## 2023-04-12 DIAGNOSIS — K130 Diseases of lips: Secondary | ICD-10-CM

## 2023-04-12 DIAGNOSIS — R051 Acute cough: Secondary | ICD-10-CM
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
## 2023-05-25 DIAGNOSIS — Z3046 Encounter for surveillance of implantable subdermal contraceptive: Secondary | ICD-10-CM | POA: Diagnosis not present

## 2023-07-24 ENCOUNTER — Ambulatory Visit
Admission: RE | Admit: 2023-07-24 | Discharge: 2023-07-24 | Disposition: A | Discharge status: Home / Self Care | Payer: 59 | Source: Ambulatory Visit | Attending: Obstetrics and Gynecology | Admitting: Obstetrics and Gynecology

## 2023-07-24 DIAGNOSIS — N6489 Other specified disorders of breast: Secondary | ICD-10-CM | POA: Diagnosis not present

## 2023-07-24 DIAGNOSIS — Z9882 Breast implant status: Secondary | ICD-10-CM | POA: Diagnosis not present

## 2023-07-24 DIAGNOSIS — R92333 Mammographic heterogeneous density, bilateral breasts: Secondary | ICD-10-CM | POA: Diagnosis not present

## 2023-07-29 ENCOUNTER — Other Ambulatory Visit: Payer: Self-pay | Admitting: Obstetrics and Gynecology

## 2023-07-29 DIAGNOSIS — N6489 Other specified disorders of breast: Secondary | ICD-10-CM

## 2023-10-02 ENCOUNTER — Ambulatory Visit: Admission: EM | Admit: 2023-10-02 | Discharge: 2023-10-02 | Disposition: A | Discharge status: Home / Self Care

## 2023-10-02 ENCOUNTER — Encounter: Payer: Self-pay | Admitting: Emergency Medicine

## 2023-10-02 DIAGNOSIS — R220 Localized swelling, mass and lump, head: Secondary | ICD-10-CM | POA: Diagnosis not present

## 2023-10-02 DIAGNOSIS — K068 Other specified disorders of gingiva and edentulous alveolar ridge: Secondary | ICD-10-CM

## 2023-10-02 MED ORDER — NAPROXEN 375 MG PO TABS: 375.0000 mg | ORAL_TABLET | Freq: Two times a day (BID) | ORAL | 0 refills | Status: AC | PRN

## 2023-10-02 MED ORDER — LIDOCAINE VISCOUS HCL 2 % MT SOLN: 15.0000 mL | OROMUCOSAL | 0 refills | Status: DC | PRN

## 2023-10-02 NOTE — Discharge Instructions (Signed)
 Rinse every 3 hours as needed and prior to meals with lidocaine  viscous rinse for approximately 30 seconds and spit.  Take Naprosyn 375 twice daily as needed for oral pain if your symptoms have not improved within the next week follow-up with your dental provider as I am concerned you may be having some nerve pain related to recent dental procedure.

## 2023-10-02 NOTE — ED Provider Notes (Signed)
 [name]    CSN: [id number] Arrival date & time: 10/02/23  1620      History   Chief Complaint Chief Complaint  Patient presents with   Dental Pain   Mouth Injury    HPI Amanda Farley is a 43 y.o. female.  Patient is here with upper gum pain in the posterior aspect of her mouth.  She reports she is status post 3 weeks of having a filling placed in the area in which she is experiencing the pain is where she was and at the sides prior to the filling procedure being completed.  She has been experiencing worsening of the pain which is exacerbated by eating and drinking fluids.  She was given symptoms time to subside however oral pain has subsequently worsened.  She has not reached out to her dental provider.   Past Medical History:  Diagnosis Date   Hearing loss     Patient Active Problem List   Diagnosis Date Noted   Routine general medical examination at a health care facility 06/20/2022   Ovarian cyst 11/26/2015    Past Surgical History:  Procedure Laterality Date   INNER EAR SURGERY Left 2014   LAPAROSCOPIC APPENDECTOMY N/A 05/08/2015   Procedure: APPENDECTOMY LAPAROSCOPIC;  Surgeon: Oralee Billow, MD;  Location: [facility];  Service: General;  Laterality: N/A;   Left TM      OB History     Gravida  2   Para  2   Term  1   Preterm  1   AB  0   Living  2      SAB  0   IAB  0   Ectopic  0   Multiple  0   Live Births  2            Home Medications    Prior to Admission medications   Medication Sig Start Date End Date Taking? Authorizing Provider  etonogestrel  (NEXPLANON ) 68 MG IMPL implant [facility] 05/25/23  Yes [provider]  lidocaine  (XYLOCAINE ) 2 % solution Use as directed 15 mLs in the mouth or throat every 3 (three) hours as needed for mouth pain. 10/02/23  Yes Buena Carmine, NP  naproxen (NAPROSYN) 375 MG tablet Take 1 tablet (375 mg total) by mouth 2 (two) times daily as needed (Mouth Pain).  10/02/23  Yes Buena Carmine, NP  albuterol  (VENTOLIN  HFA) 108 (90 Base) MCG/ACT inhaler Inhale 1-2 puffs into the lungs every 6 (six) hours as needed for wheezing or shortness of breath. 05/11/22   Dodson Freestone, FNP  fluticasone  (FLONASE ) 50 MCG/ACT nasal spray Place 2 sprays into both nostrils daily. 06/10/22   Zorita Hiss, NP  mupirocin  ointment (BACTROBAN ) 2 % Apply 1 Application topically 2 (two) times daily. 11/24/22   Henson, Vickie L, NP-C  triamcinolone  (KENALOG ) 0.025 % ointment Apply 1 Application topically 2 (two) times daily. 11/24/22   Abram Abraham, NP-C    Family History Family History  Family history unknown: Yes    Social History Social History   Tobacco Use   Smoking status: Never   Smokeless tobacco: Never  Vaping Use   Vaping status: Never Used  Substance Use Topics   Alcohol use: No   Drug use: No     Allergies   Patient has no known allergies.   Review of Systems Review of Systems Pertinent negatives listed in HPI   Physical Exam Triage Vital Signs ED Triage Vitals  Encounter Vitals Group     BP 10/02/23 1826 108/71     Systolic BP Percentile --      Diastolic BP Percentile --      Pulse Rate 10/02/23 1826 67     Resp 10/02/23 1826 20     Temp 10/02/23 1826 98.5 F (36.9 C)     Temp Source 10/02/23 1849 Oral     SpO2 10/02/23 1826 98 %     Weight 10/02/23 1848 125 lb (56.7 kg)     Height --      Head Circumference --      Peak Flow --      Pain Score 10/02/23 1847 7     Pain Loc --      Pain Education --      Exclude from Growth Chart --    No data found.  Updated Vital Signs BP 108/71 (BP Location: Left Arm)   Pulse 67   Temp 98.5 F (36.9 C) (Oral)   Resp 16   Wt 125 lb (56.7 kg)   LMP 09/14/2023   SpO2 98%   BMI 22.86 kg/m   Visual Acuity Right Eye Distance:   Left Eye Distance:   Bilateral Distance:    Right Eye Near:   Left Eye Near:    Bilateral Near:     Physical Exam Vitals reviewed.  Constitutional:       Appearance: Normal appearance.  HENT:     Head: Normocephalic and atraumatic.     Mouth/Throat:     Mouth: Mucous membranes are moist.     Dentition: Dental tenderness (right upper gum line erythematous) present.     Pharynx: Oropharynx is clear. Uvula midline.  Eyes:     Extraocular Movements: Extraocular movements intact.     Pupils: Pupils are equal, round, and reactive to light.  Cardiovascular:     Rate and Rhythm: Normal rate and regular rhythm.  Pulmonary:     Effort: Pulmonary effort is normal.     Breath sounds: Normal breath sounds.  Skin:    General: Skin is warm and dry.     Capillary Refill: Capillary refill takes less than 2 seconds.  Neurological:     General: No focal deficit present.     Mental Status: She is alert.      UC Treatments / Results  Labs (all labs ordered are listed, but only abnormal results are displayed) Labs Reviewed - No data to display  EKG   Radiology No results found.  Procedures Procedures (including critical care time)  Medications Ordered in UC Medications - No data to display  Initial Impression / Assessment and Plan / UC Course  I have reviewed the triage vital signs and the nursing notes.  Pertinent labs & imaging results that were available during my care of the patient were reviewed by me and considered in my medical decision making (see chart for details).    Pain and swelling of the gums,  Lidocaine  viscous prescribed every 3 hours as needed for pain. Proxen 375 twice daily as needed for pain.  Advised if oral pain does not improve with prescribed treatment within a week to follow-up with dental provider for further evaluation of what is causing the symptoms. Final Clinical Impressions(s) / UC Diagnoses   Final diagnoses:  Pain in gums  Swelling of gums     Discharge Instructions      Rinse every 3 hours as needed and prior to meals with lidocaine  viscous  rinse for approximately 30 seconds and spit.   Take Naprosyn 375 twice daily as needed for oral pain if your symptoms have not improved within the next week follow-up with your dental provider as I am concerned you may be having some nerve pain related to recent dental procedure.     ED Prescriptions     Medication Sig Dispense Auth. Provider   lidocaine  (XYLOCAINE ) 2 % solution Use as directed 15 mLs in the mouth or throat every 3 (three) hours as needed for mouth pain. 100 mL Buena Carmine, NP   naproxen (NAPROSYN) 375 MG tablet Take 1 tablet (375 mg total) by mouth 2 (two) times daily as needed (Mouth Pain). 20 tablet Buena Carmine, NP      PDMP not reviewed this encounter.   Buena Carmine, NP 10/03/23 1218

## 2023-10-02 NOTE — ED Triage Notes (Signed)
 Pt c/o pain in the mouth from a swollen area on the roof of her mouth. Pt states the selling happened after getting a fill in. She says the actual tooth doesn't hurt but the sore in the top of her mouth makes it hard to eat.

## 2023-10-05 ENCOUNTER — Encounter: Payer: Self-pay | Admitting: Obstetrics and Gynecology

## 2023-12-05 ENCOUNTER — Encounter: Payer: Self-pay | Admitting: Internal Medicine

## 2023-12-05 ENCOUNTER — Ambulatory Visit: Admitting: Internal Medicine

## 2023-12-05 VITALS — BP 100/60 | HR 81 | Temp 98.3°F | Ht 62.0 in | Wt 127.0 lb

## 2023-12-05 DIAGNOSIS — Z0000 Encounter for general adult medical examination without abnormal findings: Secondary | ICD-10-CM | POA: Diagnosis not present

## 2023-12-05 DIAGNOSIS — Z131 Encounter for screening for diabetes mellitus: Secondary | ICD-10-CM

## 2023-12-05 DIAGNOSIS — Z13220 Encounter for screening for lipoid disorders: Secondary | ICD-10-CM | POA: Diagnosis not present

## 2023-12-05 LAB — COMPREHENSIVE METABOLIC PANEL WITH GFR
ALT: 14 U/L (ref 0–35)
AST: 20 U/L (ref 0–37)
Albumin: 4.6 g/dL (ref 3.5–5.2)
Alkaline Phosphatase: 34 U/L — ABNORMAL LOW (ref 39–117)
BUN: 13 mg/dL (ref 6–23)
CO2: 28 meq/L (ref 19–32)
Calcium: 9.2 mg/dL (ref 8.4–10.5)
Chloride: 103 meq/L (ref 96–112)
Creatinine, Ser: 0.7 mg/dL (ref 0.40–1.20)
GFR: 104.76 mL/min (ref >60.00)
Glucose, Bld: 115 mg/dL — ABNORMAL HIGH (ref 70–99)
Potassium: 3.9 meq/L (ref 3.5–5.1)
Sodium: 137 meq/L (ref 135–145)
Total Bilirubin: 1.1 mg/dL (ref 0.2–1.2)
Total Protein: 7.8 g/dL (ref 6.0–8.3)

## 2023-12-05 LAB — LIPID PANEL
Cholesterol: 240 mg/dL — ABNORMAL HIGH (ref 0–200)
HDL: 46.7 mg/dL (ref >39.00)
LDL Cholesterol: 176 mg/dL — ABNORMAL HIGH (ref 0–99)
NonHDL: 192.95
Total CHOL/HDL Ratio: 5
Triglycerides: 83 mg/dL (ref 0.0–149.0)
VLDL: 16.6 mg/dL (ref 0.0–40.0)

## 2023-12-05 LAB — CBC
HCT: 41 % (ref 36.0–46.0)
Hemoglobin: 13.2 g/dL (ref 12.0–15.0)
MCHC: 32.1 g/dL (ref 30.0–36.0)
MCV: 85.9 fl (ref 78.0–100.0)
Platelets: 195 10*3/uL (ref 150.0–400.0)
RBC: 4.78 Mil/uL (ref 3.87–5.11)
RDW: 13.4 % (ref 11.5–15.5)
WBC: 3.3 10*3/uL — ABNORMAL LOW (ref 4.0–10.5)

## 2023-12-05 LAB — HEMOGLOBIN A1C: Hgb A1c MFr Bld: 5.2 % (ref 4.6–6.5)

## 2023-12-05 NOTE — Progress Notes (Signed)
   Subjective:   Patient ID: Amanda Farley, female    DOB: [birth year], 43 y.o.   MRN: [id number]  HPI The patient is here for physical.  PMH, [facility], social history reviewed and updated  Review of Systems  Constitutional: Negative.   HENT: Negative.    Eyes: Negative.   Respiratory:  Negative for cough, chest tightness and shortness of breath.   Cardiovascular:  Negative for chest pain, palpitations and leg swelling.  Gastrointestinal:  Negative for abdominal distention, abdominal pain, constipation, diarrhea, nausea and vomiting.  Musculoskeletal: Negative.   Skin: Negative.   Neurological: Negative.   Psychiatric/Behavioral: Negative.      Objective:  Physical Exam Constitutional:      Appearance: She is well-developed.  HENT:     Head: Normocephalic and atraumatic.   Cardiovascular:     Rate and Rhythm: Normal rate and regular rhythm.  Pulmonary:     Effort: Pulmonary effort is normal. No respiratory distress.     Breath sounds: Normal breath sounds. No wheezing or rales.  Abdominal:     General: Bowel sounds are normal. There is no distension.     Palpations: Abdomen is soft.     Tenderness: There is no abdominal tenderness. There is no rebound.   Musculoskeletal:     Cervical back: Normal range of motion.   Skin:    General: Skin is warm and dry.   Neurological:     Mental Status: She is alert and oriented to person, place, and time.     Coordination: Coordination normal.     Vitals:   12/05/23 1345  BP: 100/60  Pulse: 81  Temp: 98.3 F (36.8 C)  TempSrc: Oral  SpO2: 98%  Weight: 127 lb (57.6 kg)  Height: 5' 2 (1.575 m)    Assessment & Plan:

## 2023-12-05 NOTE — Assessment & Plan Note (Signed)
 Flu shot yearly. Tetanus counseled. Mammogram up to date, pap smear up to date. Counseled about sun safety and mole surveillance. Counseled about the dangers of distracted driving. Given 10 year screening recommendations.

## 2023-12-06 ENCOUNTER — Ambulatory Visit: Payer: Self-pay | Admitting: Internal Medicine

## 2024-01-22 ENCOUNTER — Ambulatory Visit
Admission: RE | Admit: 2024-01-22 | Discharge: 2024-01-22 | Disposition: A | Discharge status: Home / Self Care | Source: Ambulatory Visit | Attending: Obstetrics and Gynecology | Admitting: Obstetrics and Gynecology

## 2024-01-22 DIAGNOSIS — N6489 Other specified disorders of breast: Secondary | ICD-10-CM

## 2024-01-22 DIAGNOSIS — R928 Other abnormal and inconclusive findings on diagnostic imaging of breast: Secondary | ICD-10-CM | POA: Diagnosis not present

## 2024-01-31 DIAGNOSIS — Z6822 Body mass index (BMI) 22.0-22.9, adult: Secondary | ICD-10-CM | POA: Diagnosis not present

## 2024-01-31 DIAGNOSIS — Z01419 Encounter for gynecological examination (general) (routine) without abnormal findings: Secondary | ICD-10-CM | POA: Diagnosis not present

## 2024-03-21 DIAGNOSIS — N644 Mastodynia: Secondary | ICD-10-CM | POA: Diagnosis not present
